# Patient Record
Sex: Male | Born: 1940 | Race: White | Hispanic: No | Smoking: Current every day smoker
Health system: Southern US, Community
[De-identification: ages and names within clinical notes are randomized; demographics above are authoritative.]

## PROBLEM LIST (undated history)

## (undated) DIAGNOSIS — E785 Hyperlipidemia, unspecified: Secondary | ICD-10-CM

## (undated) DIAGNOSIS — I10 Essential (primary) hypertension: Secondary | ICD-10-CM

## (undated) DIAGNOSIS — C801 Malignant (primary) neoplasm, unspecified: Secondary | ICD-10-CM

## (undated) HISTORY — PX: INNER EAR SURGERY: SHX679

## (undated) HISTORY — DX: Malignant (primary) neoplasm, unspecified: C80.1

## (undated) HISTORY — PX: OTHER SURGICAL HISTORY: SHX169

## (undated) HISTORY — DX: Hyperlipidemia, unspecified: E78.5

---

## 1998-05-10 ENCOUNTER — Emergency Department (HOSPITAL_COMMUNITY): Admission: EM | Admit: 1998-05-10 | Discharge: 1998-05-10 | Payer: Self-pay | Admitting: *Deleted

## 2004-11-05 ENCOUNTER — Ambulatory Visit: Payer: Self-pay | Admitting: Family Medicine

## 2005-05-01 ENCOUNTER — Ambulatory Visit: Payer: Self-pay | Admitting: Family Medicine

## 2005-05-14 ENCOUNTER — Ambulatory Visit: Payer: Self-pay | Admitting: Family Medicine

## 2005-06-11 ENCOUNTER — Ambulatory Visit: Payer: Self-pay | Admitting: Family Medicine

## 2005-10-15 ENCOUNTER — Ambulatory Visit: Payer: Self-pay | Admitting: Family Medicine

## 2006-02-02 ENCOUNTER — Ambulatory Visit: Payer: Self-pay | Admitting: Family Medicine

## 2006-03-26 ENCOUNTER — Ambulatory Visit: Payer: Self-pay | Admitting: Family Medicine

## 2006-04-02 ENCOUNTER — Ambulatory Visit: Payer: Self-pay | Admitting: Family Medicine

## 2006-06-08 ENCOUNTER — Ambulatory Visit: Payer: Self-pay | Admitting: Family Medicine

## 2006-07-22 ENCOUNTER — Ambulatory Visit: Payer: Self-pay | Admitting: Family Medicine

## 2006-08-09 ENCOUNTER — Ambulatory Visit: Payer: Self-pay | Admitting: Family Medicine

## 2007-02-09 ENCOUNTER — Ambulatory Visit: Payer: Self-pay | Admitting: Family Medicine

## 2007-03-25 ENCOUNTER — Ambulatory Visit: Payer: Self-pay | Admitting: Family Medicine

## 2007-04-04 ENCOUNTER — Ambulatory Visit (HOSPITAL_COMMUNITY): Admission: RE | Admit: 2007-04-04 | Discharge: 2007-04-05 | Payer: Self-pay | Admitting: Otolaryngology

## 2008-03-24 ENCOUNTER — Encounter: Admission: RE | Admit: 2008-03-24 | Discharge: 2008-03-24 | Payer: Self-pay | Admitting: Otolaryngology

## 2008-04-10 ENCOUNTER — Encounter: Admission: RE | Admit: 2008-04-10 | Discharge: 2008-04-10 | Payer: Self-pay | Admitting: Otolaryngology

## 2011-04-03 NOTE — Op Note (Addendum)
NAME:  Jonathan Sparks, Jonathan Sparks              ACCOUNT NO.:  [id number]   MEDICAL RECORD NO.:  [id number]          PATIENT TYPE:  AMB   LOCATION:  SDS                          FACILITY:  [facility]   PHYSICIAN:  Carolan Shiver, M.D.    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  He  is here today for a Clarion high res 90 K multichannel cochlear implant,  left ear.  [name] has had Meniere's disease for the past 20 years.  He had seen Dr. Flo Shanks originally.  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  Dr. 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  He went on over the years to lose more hearing in the  left ear.  He had undergone a failed endolymphatic sac decompression AS,  failed all conservative medical management, failed intratympanic  dexamethasone and gentamicin.  The gentamicin did stabilize the vertigo  for a long time and his hearing improved, but over the last few years,  his hearing has decreased to non serviceable hearing AS.  On audiometric  testing on March 01, 2007, Mr. Jonathan Sparks was found to have SRTs of 110 dB  AU with 0% discrimination AU.  With a left BTE hearing aid, he had an  SRT of 80 dB with 40% discrimination again with visual aides.  He gained  very little benefit from the hearing aid with aided thresholds in the 50-  70 dB range and SRT of 55 and discrimination of 32%.  Hearing and noise  testing showed very poor scores. CNC list 9 showed 0% for total words  correct and total phonemes correct presented at 110 dB AU.  Hearing and  noise testing 17 out of 56 words correct in the left ear  and none in the  right ear. CNC word list 8 had presented at 80 dB showed total words  correct to be 8 out of 50 for 16%.   Mr. Jonathan Sparks had been counseled over the years about the possibility of  a multichannel cochlear implant.  He and his wife presented for several  sessions and were well informed of the risks and benefits of  multichannel cochlear implantation.  He was told there were no  guarantees, that there were a third of patients were poor performers, a  third were middle performers and a third where high performers and it  was unknown as to which group he would fall into.  Again there were no  guarantees made that he would be able to regain any hearing sensation  with a multichannel cochlear implant.  He underwent preoperative MRI and  CT scanning.  The MRI was done to make sure he did not have any  recurrence  of the right-sided acoustic neuroma and the MRI was negative.  CT scanning of his temporal bone showed a patent cochlea on the left and  the previous mastoidectomy from the endolymphatic sac decompression.  He  received a Pneumovax vaccine in preparation for the procedure and  underwent a complete physical examination by Dr. Lysbeth Galas, his family  doctor.  Preop lab testing was within normal limits.  He was noted to be  a difficult intubation from his previous procedures.   Risks and complications of multichannel cochlear implantation were  explained to [name].  Questions were invited and answered  and informed consent was signed and witnessed.  He completed our written  informed consent form which he was able to read because he was not able  to hear.   JUSTIFICATION FOR OUTPATIENT SETTING:  The patient's age and general  endotracheal anesthesia.   JUSTIFICATION FOR OVERNIGHT STAY:  Two three hours of observation,  facial function and equilibrium after multichannel cochlear  implantation, left ear.   PREOPERATIVE DIAGNOSES:  1. Profound bilateral  sensorineural hearing loss, left ear secondary      to endstage Meniere's disease.  2. 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 Tobie Poet at [facility].)   PROCEDURE:  Clarion high res 90K multichannel cochlear electrode  implant, left ear.   SURGEON:  Carolan Shiver, M.D.   ANESTHESIA:  General endotracheal, Dr. Randa Evens.   COMPLICATIONS:  None.   SUMMARY OF REPORT:  After the patient was taken to the operating room,  he was placed in the supine position.  His oropharynx was anesthetized  by Dr. Randa Evens in preparation for an awake intubation given the history  of difficult intubation.  The patient was ultimately intubated using the  glide scope.  Once intubated, he was properly positioned and monitored.  Elbows and ankles padded with foam rubber and a Foley catheter was  inserted.   The hair was clipped in the left postauricular area.  His hair was taped  and a stocking cap was applied.  A cochlear implant incision inverted J  or lazy S incision was then marked and infiltrated with 9 cc of 1%  Xylocaine with 1:100,000 epinephrine.  Several wire needle electrodes  were then inserted in the left orbicularis oris and occuli muscles,  suprasternal notch and left shoulder and connected to a NIM facial nerve  monitor pre amplifier.  The patient's left ear and hemiface were then  prepped with Betadine and draped in a standard fashion for a  multichannel cochlear implant.  The patient received Ancef 1 gram IV  beginning in the holding area.   The left postauricular incision was then made and carried down through  skin and subcutaneous tissue.  The posterior aspect of the mastoid  cavity was identified and the mastoid was entered.  It was filled with  thickened mucosa.  The mucosa was debrided.  Self-retaining retractors  were placed and the mastoid was further opened with a #5 cutting bur and continuous suction  irrigation.  The digastric ridge was identified as  well as the digastric periosteum.  The facial nerve was found to be  exposed in vertical descending portion and stimulated at 0.5 mA.  Bone  posterior to the fossa incudis was then removed to identify the incus.  The facial recess was then opened with a 2 mm cutting bur and a 2 mm  diamond bur.  Middle ear was entered without difficulty.  The stapes was  identified as was the round window niche.  Facial recess was opened in  an oval fashion as per standard cochlear implant procedure.  The  cochleostomy was then begun using a 2 mm diamond bur just anterior to  the round window niche at the scala tympani.  The bone was thinned to an  eggshell fashion and the endosteum was not opened.  Cottonoids were  placed in the mastoid.   The superior portion of the incision was then created with a 15 blade  and cutting cautery.  The site for the internal receiver had been  previously marked through the skin.  This mark was identified.  A flap 6  mm thick was then created and the underlying soft tissue was removed and  later stored for cutting into small pieces of periosteum and muscle to  occlude the cochleostomy.  Using a template, bony well was drilled in  the skull to full-thickness of the template.  Two pairs of holes were  drilled on either side of the well and a 5-0 Surgilon suture was  threaded through the holes.  Bleeding was controlled with bone wax.  A  trough was drilled between the anterior portion of the well and the  posterior superior aspect of the mastoid cavity.   The cochleostomy was then further opened with 1 mm diamond bur to be the  size of the cochleostomy sizer.   Gowns and gloves were changed.  A Clarion Advanced Bionics high res 90 K  cochlear implant with high focus 1 J electrode model number CI-1400-01,  serial number [id number] was then opened under saline and inspected.  The  implant was found to be in good condition.  The  plastic tube was mounted  on the inserter with the collar of the plastic tube against the collar  of the inserter.  The slot was angled at 2 o'clock.  The ICS was then  placed in the bony well.  The plastic insertion tip was then inserted  into the cochleostomy to the first turn and the implant was inserted  gently in one smooth manner.  The J hook area was left at the  cochleostomy level.  The cochleostomy was then packed tightly with small  squares of periosteum and muscle to completely occlude the cochleostomy  and the facial recess was packed as well with the same tissue.  Two  large rectangles of Gelfoam soaked in saline were then placed in the  mastoid after the extra electrode array was curled into the mastoid  cavity.  A 5-0 Surgilon suture was then tied down to stabilize the ICS  against the bone.  The site was then copiously irrigated with bacitracin  containing saline.  A #7 Jackson-Pratt drain was placed through a separate stab incision and connected to a grenade suction.  A 2-0 silk  basket suture was used to secure the drain.  The incision was then  closed in two layers using interrupted inverted 3-0 Monocryl for  subcutaneous layer and skin was closed with skin staples.  Intraoperative x-ray was taken and the electrode array was found to be  in good position within the cochlea.  Bacitracin ointment was applied.  The ear was padded with Telfa and cotton.  A standard Glasscock mastoid  dressing was applied loosely in a standard fashion.  Foley catheter was  removed.  The patient was awakened, extubated and transferred to his  hospital bed.  He appeared to tolerate the general endotracheal  anesthesia and procedures well and left the operating room in stable  condition.   TOTAL FLUIDS:  Twenty eight hundred cc.   ESTIMATED BLOOD LOSS:  Thirty ml.   TOTAL URINE OUTPUT:  Four hundred ml.   SPONGE NEEDLE AND ____ QA MARKER 704 ____ COUNT  termination of procedure.    SPECIMENS:  No specimens were sent to pathology.   Facial nerve stimulated at 0.5 mA at horizontal portion, the second genu  and the vertical descending portion.  The patient received Ancef 1 gram  IV, Zofran 4 mg IV at the beginning of the procedure and Decadron 10 mg  IV.   Mr. Mcintire will be admitted to an inpatient private room for overnight  observation.  If stable overnight, he will be discharged with his wife  on Apr 05, 2007.  He will be instructed to return to my office on Apr 13, 2007 at 4:20 p.m.  Discharge medications include Ceftin 500 mg p.o.  b.i.d. x10 days with food, Vicodin #30 tablets with one refill 1 p.o.  q.6h. p.r.n. pain, Phenergan suppositories 12.5 mg #2 1 p.r. q.6h.  p.r.n. nausea and Valium 5 mg #20 1 p.o. t.i.d. p.r.n. dizziness or  vertigo.  He is to follow a regular diet, keep his head elevated, avoid  aspirin or aspirin products, avoid nose blowing or sneezing for 2 weeks  without his mouth open and he is to call [phone] for any postoperative  problems related to the procedures.  His stimulation of the implant will  be scheduled for approximately 3 weeks.  He understands it will take  approximately 1-2 years for him how to use the implant.   SUMMARY:  Routine multichannel cochlear implant using [employer]  Clarion high res 90 K cochlear implant with a high focus 1 J electrode  model CI-1400-01, serial number [id number].  The patient's mastoid had been  previously drilled, the facial nerve was exposed at the vertical  descending portion.  The nerve stimulated at the horizontal portion.  The second genu and vertical descending portion had 0.5 mA throughout  the case.  Full length insertion of the electrode array was performed in  the first attempt.  The ICS was stabilized in a bony well and with a 5-0  Surgilon suture.  A #7 Jackson-Pratt drain was used.  A conventional  inverted J incision was used.  The patient was a difficult intubation which was  known previously.  He was intubated with a guide scope without  difficulty.          ______________________________  Carolan Shiver, M.D.    EMK/MEDQ  D:  04/04/2007  T:  04/04/2007  Job:  [id number]   cc:   Delaney Meigs, M.D.

## 2011-04-03 NOTE — Discharge Summary (Signed)
NAME:  Jonathan Sparks, Jonathan Sparks              ACCOUNT NO.:  [id number]   MEDICAL RECORD NO.:  [id number]          PATIENT TYPE:  OIB   LOCATION:  5704                         FACILITY:  [facility]   PHYSICIAN:  Carolan Shiver, M.D.    DATE OF BIRTH:  09-Feb-1941   DATE OF ADMISSION:  04/04/2007  DATE OF DISCHARGE:  04/05/2007                               DISCHARGE SUMMARY   ADMISSION DIAGNOSES:  1. Profound bilateral sensorineural hearing loss.  2. End-stage Meniere's disease, left ear.  3. Status post acoustic neuroma excision, right ear, with profound      deafness, right ear.  (Procedure performed at the [facility] at [facility] by Dr. Tobie Poet in the past).   DISCHARGE DIAGNOSES:  1. Profound bilateral sensorineural hearing loss.  2. End-stage Meniere's disease, left ear.  3. Status post acoustic neuroma excision, right ear, with profound      deafness, right ear.  (Procedure performed at the [facility] at [facility] by Dr. Tobie Poet in the past).   OPERATION:  Clarion High Res 90 K with a 1J electrode cochlear implant,  left ear.   SURGEON:  Carolan Shiver, M.D.   ANESTHESIA:  General endotracheal by Judie Petit, M.D.   COMPLICATIONS:  None.   DISCHARGE STATUS:  Stable.   SUMMARY OF HOSPITALIZATION:  Jonathan Sparks is a 70 year old  white male who was [facility] to [facility] 04, 2007 to  undergo a cochlear implant of his left ear.  He was implanted with a  Clarion High Res 90 K cochlear implant with a 1J electrode in room #10  under general endotracheal anesthesia.  Jonathan Sparks was suffering from  end-stage Meniere's disease in his left ear which left him with almost  no hearing, and he had lost all of his hearing in his right ear  following an excision of an acoustic neuroma in the past by Dr. Tobie Poet at the [place] [facility].  Mr.  Sparks was struggling with  use of a behind the ear hearing aid in the  left ear.  As his hearing had deteriorated over the years, he was  recommended for multichannel cochlear implant, left ear.  The risks and  complications of the procedure were explained to Mr. and Mrs. Sparks.  Questions were invited and answered, and informed consent was signed and  witnessed.   On Apr 04, 2007 Jonathan Sparks was taken to room #10 at [facility]  operating room and underwent an uncomplicated implantation of an  [employer] Clarion High Res 90 K cochlear implant with a 1J  electrode.  A full-length insertion of the electrode array was  accomplished on the first attempt.  The cochleostomy was sealed with  connective tissue and muscle.  His facial nerve had already been exposed  in the vertical descending portion during a previous endolymphatic sac  decompression of his left ear.   Jonathan Sparks had an uncomplicated afebrile postoperative course.  He was  awake, alert, and stable on the first postoperative evening with intact  facial function, no vertigo, and no nystagmus.  He was ambulated on the  first evening without difficulty.   On Apr 05, 2007 he was awake, alert, and stable and had had a quiet  evening.  He was eating.  Facial function was intact.  He had no  nystagmus or complaints of pain, vertigo, or tinnitus.  His Al Pimple drain was removed without difficulty, and he had no evidence of  any subflap hematoma.   He was discharged on the morning of Apr 05, 2007 with his wife who was  instructed to return him to my office on Apr 13, 2007 for follow up and  staple removal.   DISCHARGE MEDICATIONS:  1. Ceftin 500 mg p.o. b.i.d. x10 days with food.  2. Vicodin, #30, one p.o. q.6h. p.r.n. pain.  3. Phenergan suppositories 12.5 mg, #2, one PR q.6h. p.r.n. nausea.  4. Valium 5 mg, #30 tabs, one p.o. t.i.d. p.r.n. vertigo or dizziness.   He is to keep head elevated, avoid aspirin or aspirin products, avoid   nose blowing or sneezing for the next 2 weeks without his mouth open,  and to follow a regular diet.  He is to call [phone] for any  postoperative problems related to the procedures.  He was given both  verbal and written instructions.   There were no pathologic specimens.   ADMISSION LABORATORY DATA:  All within normal limits, with the exception  of a chest X-ray which showed some baseline emphysema.  The patient did  require some nasal O2 during the postoperative recovery to maintain  saturations above 90.   At the time of hospitalization, he was in [facility] OR room #10, the  PACU, and 5700, room 5704.   It should be noted that the patient was known preoperatively to be a  difficult patient to intubate.  This would have been known at [facility], and the patient has a letter concerning his difficult intubation.  Indeed, he was found to have a very anterior larynx; however, he was  intubated awake by Dr. Randa Evens using a GlideScope, and this was  accomplished without difficulty.  He is a difficult intubation, and his  family has been made well aware of this fact for any future anesthetics.           ______________________________  Carolan Shiver, M.D.     EMK/MEDQ  D:  04/05/2007  T:  04/05/2007  Job:  [id number]   cc:   Delaney Meigs, M.D.

## 2011-04-03 NOTE — H&P (Signed)
NAME:  Jonathan, Sparks      ACCOUNT NO.:  [id number]   MEDICAL RECORD NO.:  [id number]          PATIENT TYPE:  AMB   LOCATION:  SDS                          FACILITY:  [facility]   PHYSICIAN:  Carolan Shiver, M.D.    DATE OF BIRTH:  28-Aug-1941   DATE OF ADMISSION:  04/04/2007  DATE OF DISCHARGE:                              HISTORY & PHYSICAL   CHIEF COMPLAINT:  Profound bilateral sensory neural hearing loss and  unable to wear a hearing aid in his left ear with any success.   HISTORY OF PRESENT ILLNESS:  Jonathan Sparks is a 70 year old  white male with Meniere's disease involving his left ear.  He has  developed end-stage Meniere's disease with profound bilateral sensory  neural hearing loss.  He had lost all of his hearing in his right ear  after undergoing an acoustic neuromyic excision by Dr. Tobie Poet at  the [place] [facility] in [place] many years ago.  He had  failed all conservative medical management and recently has failed high-  powered behind-the-ear hearing aid for the left ear.  Audiometric  testing on March 01, 2007 documented SRTs of 110 dB+ with 0%  discrimination in both ears.  An aided threshold from the left hear  revealed aided thresholds in the 50-to-70 dB range, SRT of 55 dB with  32% discrimination.  He basically failed hearing and noise testing and  CNC List A testing.   Mr. and Mrs. Jarboe were counseled that he would benefit from a  multichannel cochlear implant implanted into his left ear.  He underwent  an MRI and CT head scans.  The MRI was performed to make sure he did not  have a recurrent acoustic neuroma in the right side, as he would not be  able to have future MRI head scans and a CT scan was performed.  The MRI  was negative.  The CT scan of his head and temporal bones was also  performed and this showed a patent cochlea in the left and an obstructed  cochlea on the right.  He underwent preop physical examination  by his  family physician, Dr. Lysbeth Galas, at [facility] in [place], [place]  including a Pneumovax vaccine to help prophylax him against a meningitis  in the future.  He underwent caloric ENG testing which showed symmetric  caloric responses so as not to render him __________.   Mr. and Mrs. Brewton were counseled over a long period of time  concerning advantages and disadvantage of multichannel cochlear  implantation and he understood that there were no guarantees that he  would be able to regain any hearing sensation with a cochlear implant.  He was told that a third of patients are low performers, a third middle  and a third high performers and I did not know which group he would fall  into.  He was willing to accept this risk and wanted to proceed with a  multichannel cochlear implant, as he was not gaining much benefit from  his left behind-the-ear hearing aid.   Risks and complications of multichannel cochlear implantation were  explained to  Mr. and Mrs. Durall and he read and signed our written  cochlear implant informed consent form because he could read, but not  hear.  He was scheduled for multichannel cochlear implantation left ear  Apr 04, 2007 under general endotracheal anesthesia in Room 10 at [facility] OR.   PAST MEDICAL HISTORY:  Serious illnesses include:  1. Acoustic neuroma excised from his right ear by Dr. Tobie Poet      in the past.  2. End-stage Meniere's disease left ear.  He had previously undergone      an operation for excision of the acoustic neuroma of his right ear      and he had undergone an endolymphatic sac decompression of his left      ear.   MEDICATIONS:  Included Caduet and Valium.   ALLERGIES TO MEDICATIONS:  None reported.   FAMILY HISTORY:  Positive for some hearing loss.   SOCIAL HISTORY:  He is married, is disabled, has a high-school  education, does not use alcohol products, does use some tobacco  products.    REVIEW OF SYSTEMS:  Positive for a known history of being difficult to  intubate, loss of vision and the loss of hearing.  He denied vertigo,  disabling tinnitus.  He had falling spells or [place] due to the  Meniere's disease involving the left ear.   PHYSICAL EXAMINATION:  VITAL SIGNS:  Blood pressure was 150/80,  temperature 97, height 6 feet 1 inch, weight 220 pounds, respiratory  rate was within normal limits.  GENERAL:  He was awake, alert and conversant.  Although he could not  hear even with his hearing aid very well, he was a good lip reader.  HEENT:  Facial function was intact.  He had no nystagmus.  Pupils:  PERRLA.  __________ and canals were stable.  Both TMs were intact and  mobile.  He had a right postauricular defect from the excision of the  acoustic neuroma and he had a healed left postauricular incision from  the endolymphatic sac decompression.  Nose:  Nasal exams are negative.  Oral cavity, lips, tongue, palate normal.  NECK:  He had an anterior larynx.  Neck was otherwise negative.  CHEST:  Clear.  HEART:  Normal sinus rhythm.  ABDOMEN:  Benign.  GENITALIA/RECTAL EXAMS:  Were not done.  NEUROLOGIC EXAM:  Physiologic with the exception of the profound  bilateral hearing loss.  SKIN:  Normal.   Admission laboratory data showed some emphysema on a chest x-ray.   EKG showed some sinus bradycardia.   Hemoglobin was 16.4, hematocrit 48.1, white blood cell count 9000.  PT  was 13.7, INR 1.0.  Electrolytes were within normal limits with a sodium  of 141, potassium 3.9, chloride 101.  CO2 was slightly elevated at 35.   Preop MRI head scan showed no evidence of a recurrent acoustic neuroma  on the right.   Preoperative CT scan of his head and temporal bones showed a patent  cochlea on the left and an obliterating cochlea on the right.  There  were no other intracranial lesions noted.  Preop audiometric testing showed an SRT of 110 dB in both ears, 0%   discrimination.  Aided thresholds showed SRT of 55 dB in both ears with  32% discrimination.  He basically failed hearing and noise testing and  CNC testing.   IMPRESSION:  1. Profound bilateral sensory neural deafness with little benefit from      a left behind-the-ear  hearing aid.  2. End-stage Meniere's disease left ear.  3. Status post acoustic neuromyic excision right ear in the past      without evidence of recurrence.   RECOMMENDATIONS:  Patient has bee recommended for an advanced Bionics  high-resolution 90K cochlear implant with a high-focus 1J electrode,  Model #6I-1400-O1, Serial [id number].  Procedure is scheduled for general  endotracheal anesthesia at [facility] OR, Room 10 with a 24-hour in-  hospital stay for observation.   Again, risks and complications of multichannel cochlear implantation  were explained to Mr. and Mrs. Verdone in great detail.  He understood  that there were no guarantees that he would be able to hear with a  multichannel cochlear implant.           ______________________________  Carolan Shiver, M.D.     EMK/MEDQ  D:  04/04/2007  T:  04/04/2007  Job:  [id number]   cc:   Dr. Lysbeth Galas

## 2013-11-10 ENCOUNTER — Emergency Department (HOSPITAL_COMMUNITY)
Admission: EM | Admit: 2013-11-10 | Discharge: 2013-11-10 | Disposition: A | Discharge status: Home / Self Care | Payer: Medicare Other | Attending: Emergency Medicine | Admitting: Emergency Medicine

## 2013-11-10 ENCOUNTER — Encounter (HOSPITAL_COMMUNITY): Payer: Self-pay | Admitting: Emergency Medicine

## 2013-11-10 DIAGNOSIS — R5381 Other malaise: Secondary | ICD-10-CM | POA: Insufficient documentation

## 2013-11-10 DIAGNOSIS — I491 Atrial premature depolarization: Secondary | ICD-10-CM

## 2013-11-10 DIAGNOSIS — F17200 Nicotine dependence, unspecified, uncomplicated: Secondary | ICD-10-CM | POA: Insufficient documentation

## 2013-11-10 DIAGNOSIS — F411 Generalized anxiety disorder: Secondary | ICD-10-CM | POA: Insufficient documentation

## 2013-11-10 DIAGNOSIS — F419 Anxiety disorder, unspecified: Secondary | ICD-10-CM

## 2013-11-10 DIAGNOSIS — R002 Palpitations: Secondary | ICD-10-CM | POA: Insufficient documentation

## 2013-11-10 DIAGNOSIS — I10 Essential (primary) hypertension: Secondary | ICD-10-CM | POA: Insufficient documentation

## 2013-11-10 DIAGNOSIS — Z79899 Other long term (current) drug therapy: Secondary | ICD-10-CM | POA: Insufficient documentation

## 2013-11-10 HISTORY — DX: Essential (primary) hypertension: I10

## 2013-11-10 LAB — CBC WITH DIFFERENTIAL/PLATELET
Basophils Absolute: 0 K/uL (ref 0.0–0.1)
Basophils Relative: 0 % (ref 0–1)
Eosinophils Absolute: 0.2 10*3/uL (ref 0.0–0.7)
Eosinophils Relative: 2 % (ref 0–5)
HCT: 48.8 % (ref 39.0–52.0)
Hemoglobin: 17.2 g/dL — ABNORMAL HIGH (ref 13.0–17.0)
Lymphocytes Relative: 24 % (ref 12–46)
Lymphs Abs: 2.4 10*3/uL (ref 0.7–4.0)
MCH: 33.8 pg (ref 26.0–34.0)
MCHC: 35.2 g/dL (ref 30.0–36.0)
MCV: 95.9 fL (ref 78.0–100.0)
Monocytes Absolute: 0.8 K/uL (ref 0.1–1.0)
Monocytes Relative: 8 % (ref 3–12)
Neutro Abs: 6.9 K/uL (ref 1.7–7.7)
Neutrophils Relative %: 66 % (ref 43–77)
Platelets: 255 10*3/uL (ref 150–400)
RBC: 5.09 MIL/uL (ref 4.22–5.81)
RDW: 13.1 % (ref 11.5–15.5)
WBC: 10.4 K/uL (ref 4.0–10.5)

## 2013-11-10 LAB — COMPREHENSIVE METABOLIC PANEL WITH GFR
AST: 5 U/L (ref 0–37)
Alkaline Phosphatase: 94 U/L (ref 39–117)
BUN: 20 mg/dL (ref 6–23)
CO2: 31 meq/L (ref 19–32)
Chloride: 100 meq/L (ref 96–112)
Creatinine, Ser: 1.61 mg/dL — ABNORMAL HIGH (ref 0.50–1.35)
GFR calc non Af Amer: 42 mL/min — ABNORMAL LOW (ref >90)
Potassium: 3.6 meq/L (ref 3.5–5.1)
Total Bilirubin: 0.4 mg/dL (ref 0.3–1.2)

## 2013-11-10 LAB — COMPREHENSIVE METABOLIC PANEL
ALT: 17 U/L (ref 0–53)
Albumin: 3.7 g/dL (ref 3.5–5.2)
Calcium: 8.5 mg/dL (ref 8.4–10.5)
GFR calc Af Amer: 48 mL/min — ABNORMAL LOW (ref >90)
Glucose, Bld: 125 mg/dL — ABNORMAL HIGH (ref 70–99)
Sodium: 141 mEq/L (ref 135–145)
Total Protein: 7.3 g/dL (ref 6.0–8.3)

## 2013-11-10 LAB — POCT I-STAT TROPONIN I: Troponin i, poc: 0 ng/mL (ref 0.00–0.08)

## 2013-11-10 MED ORDER — ACETAMINOPHEN 325 MG PO TABS
650.0000 mg | ORAL_TABLET | Freq: Once | ORAL | Status: AC
  Administered 2013-11-10: 650 mg via ORAL
  Filled 2013-11-10: qty 2

## 2013-11-10 NOTE — ED Notes (Signed)
The pt is visibly upset.  He was just spoken to by the cards of his wife and she is a dnr

## 2013-11-10 NOTE — ED Notes (Signed)
The pt cannot hear he reads lips and his son is at the bedside.  The pt is complaining of  A strange feelin gin his lt upper chest that he has had for 3-4 months.  Tonight the pt was at home when the nursing home called him about his wife who just had a cardiac arrest and is here in the dept also.  The son reports that his father has been upset and stressed out and he feels like this is part of his discoomfort

## 2013-11-10 NOTE — ED Notes (Signed)
Dr Elease Hashimoto is at he bedside talking to him about his wife

## 2013-11-10 NOTE — Discharge Instructions (Signed)
 Premature Beats A premature beat is an extra heartbeat that happens earlier than normal. Premature beats are called premature atrial contractions (PACs) or premature ventricular contractions (PVCs) depending on the area of the heart where they start. CAUSES  Premature beats may be brought on by a variety of factors including:  Emotional stress.  Lack of sleep.  Caffeine.  Asthma medicines.  Stimulants.  Herbal teas.  Dietary supplements.  Alcohol. In most cases, premature beats are not dangerous and are not a sign of serious heart disease. Most patients evaluated for premature beats have completely normal heart function. Rarely, premature beats may be a sign of more significant heart problems or medical illness. SYMPTOMS  Premature beats may cause palpitations. This means you feel like your heart is skipping a beat or beating harder than usual. Sometimes, slight chest pain occurs with premature beats, lasting only a few seconds. This pain has been described as a flopping feeling inside the chest. In many cases, premature beats do not cause any symptoms and they are only detected when an electrocardiography test (EKG) or heart monitoring is performed. DIAGNOSIS  Your caregiver may run some tests to evaluate your heart such as an EKG or echocardiography. You may need to wear a portable heart monitor for several days to record the electrical activity of your heart. Blood testing may also be performed to check your electrolytes and thyroid  function. TREATMENT  Premature beats usually go away with rest. If the problem continues, your caregiver will determine a treatment plan for you.  HOME CARE INSTRUCTIONS  Get plenty of rest over the next few days until your symptoms improve.  Avoid coffee, tea, alcohol, and soda (pop, cola).  Do not smoke. SEEK MEDICAL CARE IF:  Your symptoms continue after 1 to 2 days of rest.  You have new symptoms, such as chest pain or trouble  breathing. SEEK IMMEDIATE MEDICAL CARE IF:  You have severe chest pain or abdominal pain.  You have pain that radiates into the neck, arm, or jaw.  You faint or have extreme weakness.  You have shortness of breath.  Your heartbeat races for more than 5 seconds. MAKE SURE YOU:  Understand these instructions.  Will watch your condition.  Will get help right away if you are not doing well or get worse. Document Released: 12/10/2004 Document Revised: 01/25/2012 Document Reviewed: 07/06/2011 [facility] Patient Information 2014 [place], [place].    Social Anxiety Disorder Social anxiety disorder, previously called social phobia, is a mental disorder. People with social anxiety disorder frequently feel nervous, afraid, or embarrassed when around other people in social situations. They constantly worry that other people are judging or criticizing them for how they look, what they say, or how they act. They may worry that other people might reject them because of their appearance or behavior. Social anxiety disorder is more than just occasional shyness or self-consciousness. It can cause severe emotional distress. It can interfere with daily life activities. Social anxiety disorder also may lead to excessive alcohol or drug use and even suicide.  Social anxiety disorder is actually one of the most common mental disorders. It can develop at any time but usually starts in the teenage years. Women are more commonly affected than men. Social anxiety disorder is also more common in people who have family members with anxiety disorders. It also is more common in people who have physical deformities or conditions with characteristics that are obvious to others, such as stuttered speech or movement abnormalities (Parkinson disease).  SYMPTOMS  In addition to feeling anxious or fearful in social situations, people with social anxiety disorder frequently have physical symptoms. Examples include:  Red face  (blushing).  Racing heart.  Sweating.  Shaky hands or voice.  Confusion.  Lightheadedness.  Upset stomach and diarrhea. DIAGNOSIS  Social anxiety disorder is diagnosed through an assessment by your caregiver. Your caregiver will ask you questions about your mood, thoughts, and reactions in social situations. Your caregiver may ask you about your medical history and use of alcohol or drugs, including prescription medications. Certain medical conditions and the use of certain substances, including caffeine, can cause symptoms similar to social anxiety disorder. Your caregiver may refer you to a mental health specialist for further evaluation or treatment. The criteria for diagnosis of social anxiety disorder are:  Marked fear or anxiety in one or more social situations in which you may be closely watched or studied by others. Examples of such situations include:  Interacting socially (having a conversation with others, going to a party, or meeting strangers).  Being observed (eating or drinking in public or being called on in class).  Performing in front of others (giving a speech).  The social situations of concern almost always cause fear or anxiety, not just occasionally.  People with social anxiety disorder fear that they will be viewed negatively in a way that will be embarrassing, will lead to rejection, or will offend others. This fear is out of proportion to the actual threat posed by the social situation.  Often the triggering social situations are avoided, or they are endured with intense fear or anxiety. The fear, anxiety, or avoidance is persistent and lasts for 6 months or longer.  The anxiety causes difficulty functioning in at least some parts of your daily life. TREATMENT  Several types of treatment are available for social anxiety disorder. These treatments are often used in combination and include:   Talk therapy. Group talk therapy allows you to see that you are  not alone with these problems. Individual talk therapy helps you address your specific anxiety issues with a caring professional. The most effective forms of talk therapy for social anxiety disorder are cognitive behavioral therapy and exposure therapy. Cognitive behavioral therapy helps you to identify and change negative thoughts and beliefs that are at the root of the disorder. Exposure therapy allows you to gradually face the situations that you fear most.  Relaxation and coping techniques. These include deep breathing, self-talk, meditation, visual imagery, and yoga. Relaxation techniques help to keep you calm in social situations.  Social Optician, dispensing.Social skills can be learned on your own or with the help of a talk therapist. They can help you feel more confident and comfortable in social situations.  Medication. For anxiety limited to performance situations (performance anxiety), medication called beta blockers can help by reducing or preventing the physical symptoms of social anxiety disorder. For more persistent and generalized social anxiety, antidepressant medication may be prescribed to help control symptoms. In severe cases of social anxiety disorder, strong antianxiety medication, called benzodiazepines, may be prescribed on a limited basis and for a short time. Document Released: 10/01/2005 Document Revised: 02/27/2013 Document Reviewed: 01/31/2013 [facility] Patient Information 2014 [place], [place].

## 2013-11-10 NOTE — ED Provider Notes (Signed)
CSN: [id number]     Arrival date & time 11/10/13  1913 History   First MD Initiated Contact with Patient 11/10/13 1946     Chief Complaint  Patient presents with  . Dizziness  . Palpitations   (Consider location/radiation/quality/duration/timing/severity/associated sxs/prior Treatment) HPI Comments: Pt was here visiting with spouse who was sent to the ED by EMS after cardiac arrest.  PT's symptoms have been chronic and recurrent over the past 12 years, getting worse, has what he describes a tunnel of vision, crowding in his mind related to social situations, around a lot of people, or going outside to check his mail and reports "anxiety" or "panic" attacks.  He felt somewhat lightheaded, no CP, had pounding in his heart and palpitations described as pounding or racing.  No abd pain, no N/V/D, no syncope.  No back pain.  No loss of vision.  No facial droop, focal numbness or weakness of arms or legs.  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    Patient is a 72 y.o. male presenting with dizziness and palpitations. The history is provided by the patient and a relative.  Dizziness Quality:  Unable to specify Associated symptoms: palpitations   Associated symptoms: no chest pain, no nausea, no shortness of breath and no vomiting   Palpitations Associated symptoms: dizziness   Associated symptoms: no chest pain, no diaphoresis, no nausea, no shortness of breath and no vomiting     Past Medical History  Diagnosis Date  . Hypertension    Past Surgical History  Procedure Laterality Date  . Inner ear surgery     No family history on file. History  Substance Use Topics  . Smoking status: Current Every Day Smoker  . Smokeless tobacco: Not on file  . Alcohol Use: No    Review of Systems  Constitutional: Positive for fatigue. Negative for fever, chills and diaphoresis.  Respiratory: Negative for shortness of breath.   Cardiovascular: Positive for palpitations.  Negative for chest pain.  Gastrointestinal: Negative for nausea, vomiting and abdominal pain.  Neurological: Positive for dizziness and light-headedness. Negative for syncope and weakness.  All other systems reviewed and are negative.    Allergies  Review of patient's allergies indicates not on file.  Home Medications   Current Outpatient Rx  Name  Route  Sig  Dispense  Refill  . atorvastatin (LIPITOR) 40 MG tablet   Oral   Take 40 mg by mouth at bedtime.         . diazepam (VALIUM) 5 MG tablet   Oral   Take 5 mg by mouth 2 (two) times daily.          Marland Kitchen triamterene-hydrochlorothiazide (MAXZIDE-25) 37.5-25 MG per tablet   Oral   Take 1 tablet by mouth daily.          BP 108/56  Pulse 62  Temp(Src) 98.3 F (36.8 C) (Oral)  Resp 18  SpO2 95% Physical Exam  Nursing note and vitals reviewed. Constitutional: He is oriented to person, place, and time. He appears well-developed and well-nourished. No distress.  HENT:  Head: Normocephalic and atraumatic.  Eyes: Conjunctivae and EOM are normal. No scleral icterus.  Neck: Normal range of motion. Neck supple.  Cardiovascular: Normal rate, regular rhythm and intact distal pulses.   Occasional extrasystoles are present.  No murmur heard. Pulmonary/Chest: Effort normal. No respiratory distress. He has no wheezes. He has no rales.  Abdominal: Soft. There is no tenderness.  Musculoskeletal: He exhibits no tenderness.  Neurological: He is alert and oriented to person, place, and time. He exhibits normal muscle tone. Coordination normal.  Skin: Skin is warm and dry. He is not diaphoretic. No pallor.  Psychiatric: Judgment and thought content normal. His affect is blunt. His affect is not angry, not labile and not inappropriate. His speech is not rapid and/or pressured. He is slowed. He does not exhibit a depressed mood.    ED Course  Procedures (including critical care time) Labs Review Labs Reviewed  CBC WITH DIFFERENTIAL -  Abnormal; Notable for the following:    Hemoglobin 17.2 (*)    All other components within normal limits  COMPREHENSIVE METABOLIC PANEL - Abnormal; Notable for the following:    Glucose, Bld 125 (*)    Creatinine, Ser 1.61 (*)    GFR calc non Af Amer 42 (*)    GFR calc Af Amer 48 (*)    All other components within normal limits  POCT I-STAT TROPONIN I   Imaging Review No results found.  EKG Interpretation    Date/Time:  Friday November 10 2013 19:24:14 EST Ventricular Rate:  76 PR Interval:  188 QRS Duration: 76 QT Interval:  356 QTC Calculation: 400 R Axis:   78 Text Interpretation:  Sinus rhythm with Premature supraventricular complexes T wave abnormality, consider inferior ischemia , seen previously Abnormal ECG PAC's are new Confirmed by [facility]  MD, MICHEAL ([id number]) on 11/10/2013 8:34:13 PM           RA sat is 95% and I interpret to be adequate MDM   1. Anxiety   2. Premature atrial beats       Pt with h/o what sounds like social anxiety.  Pt with chronic symptoms, current episode is the same as prior episodes per pt and endorsed by son.  No CP, sweats, nausea.  Not typical anginal symptoms.  ECG shows PAC's, no ischemia.  Troponin is neg.  PT improved after period of time, ok to be discharged, asked for tylenol for mild HA only.  Encouraged follow up with PCP to discuss anxiety. Discussed mild renal insuff and need to follow up with PCP as well        Jonathan Pound. Aleksis Jiggetts, MD 11/11/13 1334

## 2013-11-10 NOTE — ED Notes (Signed)
Pt. reports lightheadedness / dizziness with intermittent palpitations " it don't feel right " for several days , denies chest pain or SOB .

## 2014-06-14 DIAGNOSIS — F411 Generalized anxiety disorder: Secondary | ICD-10-CM | POA: Insufficient documentation

## 2014-06-14 DIAGNOSIS — E782 Mixed hyperlipidemia: Secondary | ICD-10-CM | POA: Insufficient documentation

## 2016-08-05 DIAGNOSIS — I10 Essential (primary) hypertension: Secondary | ICD-10-CM | POA: Insufficient documentation

## 2016-11-04 DIAGNOSIS — C61 Malignant neoplasm of prostate: Secondary | ICD-10-CM | POA: Insufficient documentation

## 2018-02-07 DIAGNOSIS — F3342 Major depressive disorder, recurrent, in full remission: Secondary | ICD-10-CM | POA: Insufficient documentation

## 2019-10-10 NOTE — Progress Notes (Deleted)
   New Patient Office Visit  Assessment & Plan:  ***  Follow-up: No follow-ups on file.   Hendricks Limes, MSN, APRN, FNP-C [facility]  Subjective:  Patient ID: Jonathan Sparks [place], male    DOB: 07-06-1941  Age: 78 y.o. MRN: [id number]  Patient Care Team: Dione Housekeeper, MD as PCP - General (Family Medicine)  CC: No chief complaint on file.   HPI Dael Cannada Greening presents to establish care. *** Patient also ***   ROS  Current Outpatient Medications:  .  atorvastatin (LIPITOR) 40 MG tablet, Take 40 mg by mouth at bedtime., Disp: , Rfl:  .  diazepam (VALIUM) 5 MG tablet, Take 5 mg by mouth 2 (two) times daily. , Disp: , Rfl:  .  triamterene-hydrochlorothiazide (MAXZIDE-25) 37.5-25 MG per tablet, Take 1 tablet by mouth daily., Disp: , Rfl:   Not on File  Past Medical History:  Diagnosis Date  . Hypertension     Past Surgical History:  Procedure Laterality Date  . INNER EAR SURGERY      No family history on file.  Social History   Socioeconomic History  . Marital status: Married    Spouse name: Not on file  . Number of children: Not on file  . Years of education: Not on file  . Highest education level: Not on file  Occupational History  . Not on file  Social Needs  . Financial resource strain: Not on file  . Food insecurity    Worry: Not on file    Inability: Not on file  . Transportation needs    Medical: Not on file    Non-medical: Not on file  Tobacco Use  . Smoking status: Current Every Day Smoker  Substance and Sexual Activity  . Alcohol use: No  . Drug use: No  . Sexual activity: Not on file  Lifestyle  . Physical activity    Days per week: Not on file    Minutes per session: Not on file  . Stress: Not on file  Relationships  . Social Herbalist on phone: Not on file    Gets together: Not on file    Attends religious service: Not on file    Active member of club or organization: Not on file   Attends meetings of clubs or organizations: Not on file    Relationship status: Not on file  . Intimate partner violence    Fear of current or ex partner: Not on file    Emotionally abused: Not on file    Physically abused: Not on file    Forced sexual activity: Not on file  Other Topics Concern  . Not on file  Social History Narrative  . Not on file    Objective:   Today's Vitals: There were no vitals taken for this visit.  Physical Exam

## 2019-10-17 ENCOUNTER — Ambulatory Visit: Payer: Self-pay | Admitting: Family Medicine

## 2019-10-25 ENCOUNTER — Ambulatory Visit: Payer: Self-pay | Admitting: Family Medicine

## 2019-10-26 NOTE — Progress Notes (Deleted)
   New Patient Office Visit  Assessment & Plan:  ***  Follow-up: No follow-ups on file.   Hendricks Limes, MSN, APRN, FNP-C [facility]  Subjective:  Patient ID: Jonathan Sparks, male    DOB: 10/20/1941  Age: 78 y.o. MRN: [id number]  Patient Care Team: Dione Housekeeper, MD as PCP - General (Family Medicine)  CC: No chief complaint on file.   HPI Jonathan Sparks presents to establish care. *** Patient also ***   ROS  Current Outpatient Medications:  .  atorvastatin (LIPITOR) 40 MG tablet, Take 40 mg by mouth at bedtime., Disp: , Rfl:  .  diazepam (VALIUM) 5 MG tablet, Take 5 mg by mouth 2 (two) times daily. , Disp: , Rfl:  .  triamterene-hydrochlorothiazide (MAXZIDE-25) 37.5-25 MG per tablet, Take 1 tablet by mouth daily., Disp: , Rfl:   Not on File  Past Medical History:  Diagnosis Date  . Hypertension     Past Surgical History:  Procedure Laterality Date  . INNER EAR SURGERY      No family history on file.  Social History   Socioeconomic History  . Marital status: Married    Spouse name: Not on file  . Number of children: Not on file  . Years of education: Not on file  . Highest education level: Not on file  Occupational History  . Not on file  Tobacco Use  . Smoking status: Current Every Day Smoker  Substance and Sexual Activity  . Alcohol use: No  . Drug use: No  . Sexual activity: Not on file  Other Topics Concern  . Not on file  Social History Narrative  . Not on file   Social Determinants of Health   Financial Resource Strain:   . Difficulty of Paying Living Expenses: Not on file  Food Insecurity:   . Worried About Charity fundraiser in the Last Year: Not on file  . Ran Out of Food in the Last Year: Not on file  Transportation Needs:   . Lack of Transportation (Medical): Not on file  . Lack of Transportation (Non-Medical): Not on file  Physical Activity:   . Days of Exercise per Week: Not on file  .  Minutes of Exercise per Session: Not on file  Stress:   . Feeling of Stress : Not on file  Social Connections:   . Frequency of Communication with Friends and Family: Not on file  . Frequency of Social Gatherings with Friends and Family: Not on file  . Attends Religious Services: Not on file  . Active Member of Clubs or Organizations: Not on file  . Attends Archivist Meetings: Not on file  . Marital Status: Not on file  Intimate Partner Violence:   . Fear of Current or Ex-Partner: Not on file  . Emotionally Abused: Not on file  . Physically Abused: Not on file  . Sexually Abused: Not on file    Objective:   Today's Vitals: There were no vitals taken for this visit.  Physical Exam

## 2019-10-27 ENCOUNTER — Ambulatory Visit: Payer: Self-pay | Admitting: Family Medicine

## 2019-11-01 ENCOUNTER — Ambulatory Visit: Payer: Self-pay | Admitting: Family Medicine

## 2019-11-02 ENCOUNTER — Other Ambulatory Visit: Payer: Self-pay

## 2019-11-03 ENCOUNTER — Ambulatory Visit (INDEPENDENT_AMBULATORY_CARE_PROVIDER_SITE_OTHER): Payer: Medicare Other | Admitting: Family Medicine

## 2019-11-03 ENCOUNTER — Encounter: Payer: Self-pay | Admitting: Family Medicine

## 2019-11-03 VITALS — BP 138/89 | HR 90 | Temp 96.9°F | Resp 20 | Ht 72.0 in | Wt 211.0 lb

## 2019-11-03 DIAGNOSIS — I872 Venous insufficiency (chronic) (peripheral): Secondary | ICD-10-CM

## 2019-11-03 DIAGNOSIS — K640 First degree hemorrhoids: Secondary | ICD-10-CM

## 2019-11-03 DIAGNOSIS — F411 Generalized anxiety disorder: Secondary | ICD-10-CM

## 2019-11-03 DIAGNOSIS — C61 Malignant neoplasm of prostate: Secondary | ICD-10-CM

## 2019-11-03 DIAGNOSIS — F3342 Major depressive disorder, recurrent, in full remission: Secondary | ICD-10-CM | POA: Diagnosis not present

## 2019-11-03 DIAGNOSIS — I10 Essential (primary) hypertension: Secondary | ICD-10-CM

## 2019-11-03 DIAGNOSIS — Z23 Encounter for immunization: Secondary | ICD-10-CM | POA: Diagnosis not present

## 2019-11-03 DIAGNOSIS — E782 Mixed hyperlipidemia: Secondary | ICD-10-CM

## 2019-11-03 DIAGNOSIS — L853 Xerosis cutis: Secondary | ICD-10-CM

## 2019-11-03 DIAGNOSIS — E559 Vitamin D deficiency, unspecified: Secondary | ICD-10-CM

## 2019-11-03 MED ORDER — CETAPHIL MOISTURIZING EX LOTN: 1.0000 "application " | TOPICAL_LOTION | Freq: Two times a day (BID) | CUTANEOUS | 11 refills | Status: DC

## 2019-11-03 MED ORDER — ESCITALOPRAM OXALATE 10 MG PO TABS: 10.0000 mg | ORAL_TABLET | Freq: Every day | ORAL | 2 refills | Status: DC

## 2019-11-03 MED ORDER — HYDROCORTISONE ACETATE 25 MG RE SUPP
25.0000 mg | Freq: Two times a day (BID) | RECTAL | 0 refills | Status: DC
Start: 2019-11-03 — End: 2019-12-18

## 2019-11-03 MED ORDER — TRIAMTERENE-HCTZ 37.5-25 MG PO TABS: 1.0000 | ORAL_TABLET | Freq: Every day | ORAL | 1 refills | Status: DC

## 2019-11-03 MED ORDER — ATORVASTATIN CALCIUM 40 MG PO TABS: 40.0000 mg | ORAL_TABLET | Freq: Every day | ORAL | 1 refills | Status: DC

## 2019-11-03 NOTE — Patient Instructions (Signed)
It was a pleasure seeing you today, Jonathan Sparks.  Information regarding what we discussed is included in this packet.  Please make an appointment to see me in 6 weeks.    If you had labs performed today, you will be contacted with the abnormal results once they are available, usually in the next 3 business days for routine lab work.  If you had STI testing, a pap smear, or a biopsy performed, expect to be contacted in about 7-10 days. If results are normal, you will not be notified.    In a few days you may receive a survey in the mail or online from [employer] regarding your visit with [place] today. Please take a moment to fill this out. Your feedback is very important to our office. It can help [place] better understand your needs as well as improve your experience and satisfaction. Thank you for taking your time to complete it. We care about you.  Because of recent events of COVID-19 ("Coronavirus"), please follow CDC recommendations:   1. Wash your hand frequently 2. Avoid touching your face 3. Stay away from people who are sick 4. If you have symptoms such as fever, cough, shortness of breath then call your healthcare provider for further guidance 5. If you are sick, STAY AT HOME, unless otherwise directed by your healthcare provider. 6. Follow directions from state and national officials regarding staying safe    Please feel free to call our office if any questions or concerns arise.  Warm Regards, Monia Pouch, FNP-C [facility] [address] [place], [place] [address] [phone]

## 2019-11-04 LAB — CMP14+EGFR
ALT: 29 IU/L (ref 0–44)
AST: 29 IU/L (ref 0–40)
Albumin/Globulin Ratio: 1.6 (ref 1.2–2.2)
Albumin: 4.5 g/dL (ref 3.7–4.7)
Alkaline Phosphatase: 105 IU/L (ref 39–117)
BUN/Creatinine Ratio: 14 (ref 10–24)
BUN: 15 mg/dL (ref 8–27)
Bilirubin Total: 0.4 mg/dL (ref 0.0–1.2)
CO2: 29 mmol/L (ref 20–29)
Calcium: 9.4 mg/dL (ref 8.6–10.2)
Chloride: 96 mmol/L (ref 96–106)
Creatinine, Ser: 1.1 mg/dL (ref 0.76–1.27)
GFR calc Af Amer: 75 mL/min/{1.73_m2} (ref >59)
GFR calc non Af Amer: 65 mL/min/{1.73_m2} (ref >59)
Globulin, Total: 2.8 g/dL (ref 1.5–4.5)
Glucose: 78 mg/dL (ref 65–99)
Potassium: 3.4 mmol/L — ABNORMAL LOW (ref 3.5–5.2)
Sodium: 139 mmol/L (ref 134–144)
Total Protein: 7.3 g/dL (ref 6.0–8.5)

## 2019-11-04 LAB — CBC WITH DIFFERENTIAL/PLATELET
Basophils Absolute: 0.1 10*3/uL (ref 0.0–0.2)
Basos: 1 %
EOS (ABSOLUTE): 0.2 10*3/uL (ref 0.0–0.4)
Eos: 2 %
Hematocrit: 55.2 % — ABNORMAL HIGH (ref 37.5–51.0)
Hemoglobin: 18.7 g/dL — ABNORMAL HIGH (ref 13.0–17.7)
Immature Grans (Abs): 0 10*3/uL (ref 0.0–0.1)
Immature Granulocytes: 0 %
Lymphocytes Absolute: 1.9 10*3/uL (ref 0.7–3.1)
Lymphs: 20 %
MCH: 32.6 pg (ref 26.6–33.0)
MCHC: 33.9 g/dL (ref 31.5–35.7)
MCV: 96 fL (ref 79–97)
Monocytes Absolute: 0.8 10*3/uL (ref 0.1–0.9)
Monocytes: 8 %
Neutrophils Absolute: 6.9 10*3/uL (ref 1.4–7.0)
Neutrophils: 69 %
Platelets: 264 10*3/uL (ref 150–450)
RBC: 5.74 x10E6/uL (ref 4.14–5.80)
RDW: 12.5 % (ref 11.6–15.4)
WBC: 9.9 10*3/uL (ref 3.4–10.8)

## 2019-11-04 LAB — THYROID PANEL WITH TSH
Free Thyroxine Index: 2.2 (ref 1.2–4.9)
T3 Uptake Ratio: 25 % (ref 24–39)
T4, Total: 8.8 ug/dL (ref 4.5–12.0)
TSH: 1.82 u[IU]/mL (ref 0.450–4.500)

## 2019-11-04 LAB — LIPID PANEL
Chol/HDL Ratio: 4.4 ratio (ref 0.0–5.0)
Cholesterol, Total: 159 mg/dL (ref 100–199)
HDL: 36 mg/dL — ABNORMAL LOW (ref >39)
LDL Chol Calc (NIH): 88 mg/dL (ref 0–99)
Triglycerides: 205 mg/dL — ABNORMAL HIGH (ref 0–149)
VLDL Cholesterol Cal: 35 mg/dL (ref 5–40)

## 2019-11-04 LAB — PSA, TOTAL AND FREE
PSA, Free: <0.02 ng/mL
Prostate Specific Ag, Serum: <0.1 ng/mL (ref 0.0–4.0)

## 2019-11-04 LAB — VITAMIN D 25 HYDROXY (VIT D DEFICIENCY, FRACTURES): Vit D, 25-Hydroxy: 15.8 ng/mL — ABNORMAL LOW (ref 30.0–100.0)

## 2019-11-04 NOTE — Progress Notes (Signed)
Subjective:  Patient ID: Jonathan Sparks, male    DOB: [birth year], 78 y.o.   MRN: [id number]  Patient Care Team: Baruch Gouty, FNP as PCP - General (Family Medicine)   Chief Complaint:  Establish Care (new - [place] )   HPI: Jonathan Sparks is a 78 y.o. male presenting on 11/03/2019 for [place] (new - [place] )   Jonathan Sparks is a 78 year old male who presents today to establish care with a new PCP as his retired. He is accompanied by his son today. Pt is very hard of hearing and has trouble even with his hearing aid. He is able to read lips and his son helps with understanding people.   He is vague with his medical history. He last saw his PCP in November of 2011. Pt reports he has been out of all of his medications since June.   He has a history or prostate cancer and underwent cystoscopy with prostate brachytherapy in 2018. He reports he was released by urology. His PSA screenings have been unremarkable since release from urology per pts report. Last PSA normal in EHR. He denies flank pain, rectal pain or pressure, hematuria, frequency, weight loss, urinary retention, or scrotal pain or swelling. No fever or chills.   He has hyperlipidemia and was taking atorvastatin, has been out since June. He does not watch his diet or exercise on a regular basis. He denies chest pain, palpitations, shortness of breath, dizziness, or syncope. He does report some lower extremity swelling and discoloration. States this has been ongoing for several year. No pain in his lower extremities.   He has HTN and was taking Maxzide until June. He does not watch diet or exercise. He does not check his blood pressure at home. He denies headaches, weakness, confusion, focal deficits, or chest pain. He does have lower extremity swelling.   He has anxiety and depression and was on Lexapro and PRN Valium. Pt states he stopped taking the Lexapro and only takes the Valium as needed. Long discussion about  medication purposes, safety, and proper use. Pt has not have Valium or Lexapro since June and has been managing fine. He denies anxiety or depressive symptoms. States at times he will get anxious and dizzy. States he will take the Valium to stop the dizzy feeling and it works well. He states he only has the dizziness when he is in a situation where he is feeling overwhelmed.     Relevant past medical, surgical, family, and social history reviewed and updated as indicated.  Allergies and medications reviewed and updated. Date reviewed: Chart in Epic.   Past Medical History:  Diagnosis Date  . Cancer [facility])    prostate cancer   . Hyperlipidemia   . Hypertension     Past Surgical History:  Procedure Laterality Date  . INNER EAR SURGERY    . prostate cancer seed      Social History   Socioeconomic History  . Marital status: Widowed    Spouse name: Not on file  . Number of children: 2  . Years of education: Not on file  . Highest education level: Not on file  Occupational History  . Occupation: retired   Tobacco Use  . Smoking status: Current Every Day Smoker    Packs/day: 0.50  . Smokeless tobacco: Never Used  Substance and Sexual Activity  . Alcohol use: No  . Drug use: No  . Sexual activity: Not on file  Other Topics  Concern  . Not on file  Social History Narrative   Lives in his home - 1 son lives with him    Social Determinants of Health   Financial Resource Strain:   . Difficulty of Paying Living Expenses: Not on file  Food Insecurity:   . Worried About Charity fundraiser in the Last Year: Not on file  . Ran Out of Food in the Last Year: Not on file  Transportation Needs:   . Lack of Transportation (Medical): Not on file  . Lack of Transportation (Non-Medical): Not on file  Physical Activity:   . Days of Exercise per Week: Not on file  . Minutes of Exercise per Session: Not on file  Stress:   . Feeling of Stress : Not on file  Social Connections:   .  Frequency of Communication with Friends and Family: Not on file  . Frequency of Social Gatherings with Friends and Family: Not on file  . Attends Religious Services: Not on file  . Active Member of Clubs or Organizations: Not on file  . Attends Archivist Meetings: Not on file  . Marital Status: Not on file  Intimate Partner Violence:   . Fear of Current or Ex-Partner: Not on file  . Emotionally Abused: Not on file  . Physically Abused: Not on file  . Sexually Abused: Not on file    Outpatient Encounter Medications as of 11/03/2019  Medication Sig  . atorvastatin (LIPITOR) 40 MG tablet Take 1 tablet (40 mg total) by mouth at bedtime.  . cetaphil (CETAPHIL) lotion Apply 1 application topically 2 (two) times daily.  . diazepam (VALIUM) 5 MG tablet Take 5 mg by mouth 2 (two) times daily.   Marland Kitchen escitalopram (LEXAPRO) 10 MG tablet Take 1 tablet (10 mg total) by mouth daily.  . hydrocortisone (ANUSOL-HC) 25 MG suppository Place 1 suppository (25 mg total) rectally 2 (two) times daily.  Marland Kitchen triamterene-hydrochlorothiazide (MAXZIDE-25) 37.5-25 MG tablet Take 1 tablet by mouth daily.  . [DISCONTINUED] atorvastatin (LIPITOR) 40 MG tablet Take 40 mg by mouth at bedtime.  . [DISCONTINUED] triamterene-hydrochlorothiazide (MAXZIDE-25) 37.5-25 MG per tablet Take 1 tablet by mouth daily.   No facility-administered encounter medications on file as of 11/03/2019.    No Known Allergies  Review of Systems  Constitutional: Negative for activity change, appetite change, chills, diaphoresis, fatigue, fever and unexpected weight change.  HENT: Positive for hearing loss.   Eyes: Negative.  Negative for photophobia and visual disturbance.  Respiratory: Negative for cough, chest tightness and shortness of breath.   Cardiovascular: Positive for leg swelling. Negative for chest pain and palpitations.  Gastrointestinal: Negative for abdominal pain, blood in stool, constipation, diarrhea, nausea, rectal  pain and vomiting.  Endocrine: Negative.  Negative for cold intolerance, polydipsia, polyphagia and polyuria.  Genitourinary: Negative for decreased urine volume, difficulty urinating, discharge, dysuria, enuresis, flank pain, frequency, hematuria, penile pain, penile swelling, scrotal swelling and urgency.  Musculoskeletal: Negative for arthralgias and myalgias.  Skin: Negative.   Allergic/Immunologic: Negative.   Neurological: Negative for dizziness, tremors, seizures, syncope, facial asymmetry, speech difficulty, weakness, light-headedness, numbness and headaches.  Hematological: Negative.  Does not bruise/bleed easily.  Psychiatric/Behavioral: Negative for agitation, behavioral problems, confusion, decreased concentration, dysphoric mood, hallucinations, self-injury, sleep disturbance and suicidal ideas. The patient is nervous/anxious. The patient is not hyperactive.   All other systems reviewed and are negative.       Objective:  BP 138/89   Pulse 90   Temp (!) 96.9  F (36.1 C)   Resp 20   Ht 6' (1.829 m)   Wt 211 lb (95.7 kg)   SpO2 93%   BMI 28.62 kg/m    Wt Readings from Last 3 Encounters:  11/03/19 211 lb (95.7 kg)    Physical Exam Vitals and nursing note reviewed.  Constitutional:      General: He is not in acute distress.    Appearance: Normal appearance. He is well-developed, well-groomed and overweight. He is not ill-appearing, toxic-appearing or diaphoretic.  HENT:     Head: Normocephalic and atraumatic.     Jaw: There is normal jaw occlusion.     Right Ear: Tympanic membrane, ear canal and external ear normal. Decreased hearing noted.     Left Ear: Tympanic membrane, ear canal and external ear normal. Decreased hearing noted.     Nose: Nose normal.     Mouth/Throat:     Lips: Pink.     Mouth: Mucous membranes are moist.     Pharynx: Oropharynx is clear. Uvula midline.  Eyes:     General: Lids are normal.     Extraocular Movements: Extraocular movements  intact.     Conjunctiva/sclera: Conjunctivae normal.     Pupils: Pupils are equal, round, and reactive to light.  Neck:     Thyroid: No thyroid mass, thyromegaly or thyroid tenderness.     Vascular: No carotid bruit or JVD.     Trachea: Trachea and phonation normal.  Cardiovascular:     Rate and Rhythm: Normal rate and regular rhythm.     Chest Wall: PMI is not displaced.     Pulses: Normal pulses.     Heart sounds: Normal heart sounds. No murmur. No friction rub. No gallop.   Pulmonary:     Effort: Pulmonary effort is normal. No respiratory distress.     Breath sounds: Normal breath sounds. No wheezing.  Abdominal:     General: Bowel sounds are normal. There is no distension or abdominal bruit.     Palpations: Abdomen is soft. There is no hepatomegaly or splenomegaly.     Tenderness: There is no abdominal tenderness. There is no right CVA tenderness or left CVA tenderness.     Hernia: No hernia is present.  Genitourinary:    Rectum: External hemorrhoid present.  Musculoskeletal:        General: Normal range of motion.     Cervical back: Normal range of motion and neck supple.     Right lower leg: 1+ Edema present.     Left lower leg: 1+ Edema present.  Lymphadenopathy:     Cervical: No cervical adenopathy.  Skin:    General: Skin is warm and dry.     Capillary Refill: Capillary refill takes less than 2 seconds.     Coloration: Skin is not cyanotic, jaundiced or pale.     Findings: Erythema and rash present. Rash is scaling.     Comments: Bilateral lower leg swelling with slight erythema, scaling, and hyperpigmentation to medial supramalleolar region.  Xerosis to bilateral lower extremities and upper extremities.   Neurological:     General: No focal deficit present.     Mental Status: He is alert and oriented to person, place, and time.     Cranial Nerves: Cranial nerves are intact. No cranial nerve deficit.     Sensory: Sensation is intact. No sensory deficit.     Motor:  Motor function is intact. No weakness.     Coordination: Coordination is intact.  Coordination normal.     Gait: Gait is intact. Gait normal.     Deep Tendon Reflexes: Reflexes are normal and symmetric. Reflexes normal.  Psychiatric:        Attention and Perception: Attention and perception normal.        Mood and Affect: Mood and affect normal. Mood is not anxious or depressed.        Speech: Speech normal.        Behavior: Behavior normal. Behavior is cooperative.        Thought Content: Thought content normal. Thought content does not include homicidal or suicidal ideation. Thought content does not include homicidal or suicidal plan.        Cognition and Memory: Cognition and memory normal.        Judgment: Judgment normal.     Results for orders placed or performed in visit on 11/03/19  CMP14+EGFR  Result Value Ref Range   Glucose 78 65 - 99 mg/dL   BUN 15 8 - 27 mg/dL   Creatinine, Ser 1.10 0.76 - 1.27 mg/dL   GFR calc non Af Amer 65 >59 mL/min/1.73   GFR calc Af Amer 75 >59 mL/min/1.73   BUN/Creatinine Ratio 14 10 - 24   Sodium 139 134 - 144 mmol/L   Potassium 3.4 (L) 3.5 - 5.2 mmol/L   Chloride 96 96 - 106 mmol/L   CO2 29 20 - 29 mmol/L   Calcium 9.4 8.6 - 10.2 mg/dL   Total Protein 7.3 6.0 - 8.5 g/dL   Albumin 4.5 3.7 - 4.7 g/dL   Globulin, Total 2.8 1.5 - 4.5 g/dL   Albumin/Globulin Ratio 1.6 1.2 - 2.2   Bilirubin Total 0.4 0.0 - 1.2 mg/dL   Alkaline Phosphatase 105 39 - 117 IU/L   AST 29 0 - 40 IU/L   ALT 29 0 - 44 IU/L  CBC with Differential/Platelet  Result Value Ref Range   WBC 9.9 3.4 - 10.8 x10E3/uL   RBC 5.74 4.14 - 5.80 x10E6/uL   Hemoglobin 18.7 (H) 13.0 - 17.7 g/dL   Hematocrit 55.2 (H) 37.5 - 51.0 %   MCV 96 79 - 97 fL   MCH 32.6 26.6 - 33.0 pg   MCHC 33.9 31.5 - 35.7 g/dL   RDW 12.5 11.6 - 15.4 %   Platelets 264 150 - 450 x10E3/uL   Neutrophils 69 Not Estab. %   Lymphs 20 Not Estab. %   Monocytes 8 Not Estab. %   Eos 2 Not Estab. %   Basos 1 Not  Estab. %   Neutrophils Absolute 6.9 1.4 - 7.0 x10E3/uL   Lymphocytes Absolute 1.9 0.7 - 3.1 x10E3/uL   Monocytes Absolute 0.8 0.1 - 0.9 x10E3/uL   EOS (ABSOLUTE) 0.2 0.0 - 0.4 x10E3/uL   Basophils Absolute 0.1 0.0 - 0.2 x10E3/uL   Immature Granulocytes 0 Not Estab. %   Immature Grans (Abs) 0.0 0.0 - 0.1 x10E3/uL  Lipid panel  Result Value Ref Range   Cholesterol, Total 159 100 - 199 mg/dL   Triglycerides 205 (H) 0 - 149 mg/dL   HDL 36 (L) >39 mg/dL   VLDL Cholesterol Cal 35 5 - 40 mg/dL   LDL Chol Calc (NIH) 88 0 - 99 mg/dL   Chol/HDL Ratio 4.4 0.0 - 5.0 ratio  Thyroid Panel With TSH  Result Value Ref Range   TSH 1.820 0.450 - 4.500 uIU/mL   T4, Total 8.8 4.5 - 12.0 ug/dL   T3 Uptake Ratio 25 24 -  39 %   Free Thyroxine Index 2.2 1.2 - 4.9  Vitamin D 25 hydroxy  Result Value Ref Range   Vit D, 25-Hydroxy 15.8 (L) 30.0 - 100.0 ng/mL  PSA, total and free  Result Value Ref Range   Prostate Specific Ag, Serum <0.1 0.0 - 4.0 ng/mL   PSA, Free <0.02 N/A ng/mL   PSA, Free Pct CANCELED %       Pertinent labs & imaging results that were available during my care of the patient were reviewed by me and considered in my medical decision making.  Assessment & Plan:  Jesselee was seen today for establish care.  Diagnoses and all orders for this visit:  Essential hypertension with goal blood pressure less than 130/80 BP fairly controlled. Will restart medications today. Goal BP is 130/80. Pt aware to report any persistent high or low readings. DASH diet and exercise encouraged. Exercise at least 150 minutes per week and increase as tolerated. Goal BMI > 25. Stress management encouraged. Avoid nicotine and tobacco product use. Avoid excessive alcohol and NSAID's. Avoid more than 2000 mg of sodium daily. Medications as prescribed. Follow up as scheduled.  -     triamterene-hydrochlorothiazide (MAXZIDE-25) 37.5-25 MG tablet; Take 1 tablet by mouth daily. -     CMP14+EGFR -     CBC with  Differential/Platelet -     Thyroid Panel With TSH  Recurrent major depressive disorder, in full remission ([place]) Anxiety, generalized No severe symptoms present. Occasional dizzy feelings when overwhelmed or anxious. Long discussion disease process, proper management, and medication safety. Pt willing to restart Lexapro. Will not refill, pt aware. Will check thyroid function today.  -     escitalopram (LEXAPRO) 10 MG tablet; Take 1 tablet (10 mg total) by mouth daily. -     Thyroid Panel With TSH  Mixed hyperlipidemia Diet encouraged - increase intake of fresh fruits and vegetables, increase intake of lean proteins. Bake, broil, or grill foods. Avoid fried, greasy, and fatty foods. Avoid fast foods. Increase intake of fiber-rich whole grains. Exercise encouraged - at least 150 minutes per week and advance as tolerated.  Goal BMI < 25. Restart medications as prescribed. Follow up in 3-6 months as discussed.  -     atorvastatin (LIPITOR) 40 MG tablet; Take 1 tablet (40 mg total) by mouth at bedtime. 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 If symptoms persist or worsen, may require referral to vascular surgery.  -     Compression stockings -     cetaphil (CETAPHIL) lotion; Apply 1 application topically 2 (two) times daily.  Vitamin D deficiency Not on repletion therapy. Will check labs today.  -     Vitamin D 25 hydroxy  Prostate cancer ([place]) Released by urology. Last PSA in 2019 was normal, will recheck today.  -     PSA, total and free  Grade I hemorrhoids Symptomatic care discussed in detail. Increase water and fiber intake. Anusol suppository as prescribed. Report any new, worsening, or continued symptoms.  -     hydrocortisone (ANUSOL-HC) 25 MG suppository; Place 1 suppository (25 mg total) rectally 2 (two) times daily.  Need for 23-polyvalent pneumococcal polysaccharide vaccine -      Pneumococcal polysaccharide vaccine 23-valent greater than or equal to 2yo subcutaneous/IM  Need for immunization against influenza -     Flu Vaccine QUAD High Dose(Fluad)     Continue  all other maintenance medications.  Follow up plan: Return in about 6 weeks (around 12/15/2019), or if symptoms worsen or fail to improve.  Continue healthy lifestyle choices, including diet (rich in fruits, vegetables, and lean proteins, and low in salt and simple carbohydrates) and exercise (at least 30 minutes of moderate physical activity daily).  Educational handout given for survey, COVID-19  The above assessment and management plan was discussed with the patient. The patient verbalized understanding of and has agreed to the management plan. Patient is aware to call the clinic if they develop any new symptoms or if symptoms persist or worsen. Patient is aware when to return to the clinic for a follow-up visit. Patient educated on when it is appropriate to go to the emergency department.   Monia Pouch, FNP-C [facility] [phone]

## 2019-11-06 MED ORDER — VITAMIN D (ERGOCALCIFEROL) 1.25 MG (50000 UNIT) PO CAPS: 50000.0000 [IU] | ORAL_CAPSULE | ORAL | 6 refills | Status: DC

## 2019-11-06 NOTE — Addendum Note (Signed)
Addended by: Baruch Gouty on: 11/06/2019 09:33 AM   Modules accepted: Orders

## 2019-11-06 NOTE — Progress Notes (Signed)
Glucose was normal.  Kidney function was normal and liver function was normal.  Potassium was slightly low at 3.4, normal is 3.5.  Make sure you are getting enough potassium in your diet. Hemoglobin and hematocrit are slightly elevated, make sure to avoid cigarette smoking and to drink enough water.  We will continue to monitor this. Glycerides are elevated 205 and HDL was low at 36.  Need to avoid fried, fatty, greasy, and fast foods.  Need to continue Lipitor as prescribed. Thyroid functions normal.  PSA is normal. Vitamin D is very low at 15.8, I want to start you on once weekly repletion therapy.  I will send this into the pharmacy, please pick up and initiate today.

## 2019-11-27 ENCOUNTER — Encounter: Payer: Self-pay | Admitting: Family Medicine

## 2019-12-15 ENCOUNTER — Other Ambulatory Visit: Payer: Self-pay

## 2019-12-18 ENCOUNTER — Encounter: Payer: Self-pay | Admitting: Family Medicine

## 2019-12-18 ENCOUNTER — Ambulatory Visit (INDEPENDENT_AMBULATORY_CARE_PROVIDER_SITE_OTHER): Payer: Medicare Other | Admitting: Family Medicine

## 2019-12-18 ENCOUNTER — Other Ambulatory Visit: Payer: Self-pay

## 2019-12-18 VITALS — BP 124/75 | HR 78 | Temp 97.3°F | Resp 20 | Ht 72.0 in | Wt 202.0 lb

## 2019-12-18 DIAGNOSIS — F3342 Major depressive disorder, recurrent, in full remission: Secondary | ICD-10-CM | POA: Diagnosis not present

## 2019-12-18 DIAGNOSIS — F411 Generalized anxiety disorder: Secondary | ICD-10-CM

## 2019-12-18 DIAGNOSIS — I10 Essential (primary) hypertension: Secondary | ICD-10-CM

## 2019-12-18 NOTE — Patient Instructions (Signed)
DASH Eating Plan DASH stands for "Dietary Approaches to Stop Hypertension." The DASH eating plan is a healthy eating plan that has been shown to reduce high blood pressure (hypertension). Additional health benefits may include reducing the risk of type 2 diabetes mellitus, heart disease, and stroke. The DASH eating plan may also help with weight loss.  WHAT DO I NEED TO KNOW ABOUT THE DASH EATING PLAN? For the DASH eating plan, you will follow these general guidelines:  Choose foods with a percent daily value for sodium of less than 5% (as listed on the food label).  Use salt-free seasonings or herbs instead of table salt or sea salt.  Check with your health care provider or pharmacist before using salt substitutes.  Eat lower-sodium products, often labeled as "lower sodium" or "no salt added."  Eat fresh foods.  Eat more vegetables, fruits, and low-fat dairy products.  Choose whole grains. Look for the word "whole" as the first word in the ingredient list.  Choose fish and skinless chicken or turkey more often than red meat. Limit fish, poultry, and meat to 6 oz (170 g) each day.  Limit sweets, desserts, sugars, and sugary drinks.  Choose heart-healthy fats.  Limit cheese to 1 oz (28 g) per day.  Eat more home-cooked food and less restaurant, buffet, and fast food.  Limit fried foods.  Cook foods using methods other than frying.  Limit canned vegetables. If you do use them, rinse them well to decrease the sodium.  When eating at a restaurant, ask that your food be prepared with less salt, or no salt if possible.  WHAT FOODS CAN I EAT? Seek help from a dietitian for individual calorie needs.  Grains Whole grain or whole wheat bread. Brown rice. Whole grain or whole wheat pasta. Quinoa, bulgur, and whole grain cereals. Low-sodium cereals. Corn or whole wheat flour tortillas. Whole grain cornbread. Whole grain crackers. Low-sodium crackers.  Vegetables Fresh or frozen  vegetables (raw, steamed, roasted, or grilled). Low-sodium or reduced-sodium tomato and vegetable juices. Low-sodium or reduced-sodium tomato sauce and paste. Low-sodium or reduced-sodium canned vegetables.   Fruits All fresh, canned (in natural juice), or frozen fruits.  Meat and Other Protein Products Ground beef (85% or leaner), grass-fed beef, or beef trimmed of fat. Skinless chicken or turkey. Ground chicken or turkey. Pork trimmed of fat. All fish and seafood. Eggs. Dried beans, peas, or lentils. Unsalted nuts and seeds. Unsalted canned beans.  Dairy Low-fat dairy products, such as skim or 1% milk, 2% or reduced-fat cheeses, low-fat ricotta or cottage cheese, or plain low-fat yogurt. Low-sodium or reduced-sodium cheeses.  Fats and Oils Tub margarines without trans fats. Light or reduced-fat mayonnaise and salad dressings (reduced sodium). Avocado. Safflower, olive, or canola oils. Natural peanut or almond butter.  Other Unsalted popcorn and pretzels. The items listed above may not be a complete list of recommended foods or beverages. Contact your dietitian for more options.  WHAT FOODS ARE NOT RECOMMENDED?  Grains White bread. White pasta. White rice. Refined cornbread. Bagels and croissants. Crackers that contain trans fat.  Vegetables Creamed or fried vegetables. Vegetables in a cheese sauce. Regular canned vegetables. Regular canned tomato sauce and paste. Regular tomato and vegetable juices.  Fruits Dried fruits. Canned fruit in light or heavy syrup. Fruit juice.  Meat and Other Protein Products Fatty cuts of meat. Ribs, chicken wings, bacon, sausage, bologna, salami, chitterlings, fatback, hot dogs, bratwurst, and packaged luncheon meats. Salted nuts and seeds. Canned beans with salt.    Dairy Whole or 2% milk, cream, half-and-half, and cream cheese. Whole-fat or sweetened yogurt. Full-fat cheeses or blue cheese. Nondairy creamers and whipped toppings. Processed cheese,  cheese spreads, or cheese curds.  Condiments Onion and garlic salt, seasoned salt, table salt, and sea salt. Canned and packaged gravies. Worcestershire sauce. Tartar sauce. Barbecue sauce. Teriyaki sauce. Soy sauce, including reduced sodium. Steak sauce. Fish sauce. Oyster sauce. Cocktail sauce. Horseradish. Ketchup and mustard. Meat flavorings and tenderizers. Bouillon cubes. Hot sauce. Tabasco sauce. Marinades. Taco seasonings. Relishes.  Fats and Oils Butter, stick margarine, lard, shortening, ghee, and bacon fat. Coconut, palm kernel, or palm oils. Regular salad dressings.  Other Pickles and olives. Salted popcorn and pretzels.  The items listed above may not be a complete list of foods and beverages to avoid. Contact your dietitian for more information.  WHERE CAN I FIND MORE INFORMATION? National Heart, Lung, and Blood Institute: www.nhlbi.nih.gov/health/health-topics/topics/dash/ Document Released: 10/22/2011 Document Revised: 03/19/2014 Document Reviewed: 09/06/2013 ExitCare Patient Information 2015 [employer]. This information is not intended to replace advice given to you by your health care provider. Make sure you discuss any questions you have with your health care provider.   I think that you would greatly benefit from seeing a nutritionist.  If you are interested, please call Dr Sykes at [phone] to schedule an appointment.   

## 2019-12-18 NOTE — Progress Notes (Signed)
Subjective:  Patient ID: Jonathan Sparks, male    DOB: 1941-03-26, 79 y.o.   MRN: [id number]  Patient Care Team: Baruch Gouty, FNP as PCP - General (Family Medicine)   Chief Complaint:  Medical Management of Chronic Issues (follow up HTN )   HPI: Jonathan Sparks is a 79 y.o. male presenting on 12/18/2019 for Medical Management of Chronic Issues (follow up HTN )   1. Essential hypertension with goal blood pressure less than 130/80 Has restarted Maxzide and is tolerating well. BP well controlled at home and normal in office today. Will recheck renal function and electrolytes today. No chest pain, headaches, confusion, weakness, or dizziness. No leg swelling.   2. Recurrent major depressive disorder, in full remission ([place]) 3. Anxiety, generalized Doing well since initiation of Lexapro. Tolerating medication well without associated side effects. Denies anxiety, depression, or dizziness.  Depression screen [facility] 2/9 12/18/2019 11/03/2019  Decreased Interest 0 0  Down, Depressed, Hopeless 0 0  PHQ - 2 Score 0 0   GAD 7 : Generalized Anxiety Score 12/18/2019  Nervous, Anxious, on Edge 0  Control/stop worrying 0  Worry too much - different things 0  Trouble relaxing 0  Restless 0  Easily annoyed or irritable 0  Afraid - awful might happen 0  Total GAD 7 Score 0       Relevant past medical, surgical, family, and social history reviewed and updated as indicated.  Allergies and medications reviewed and updated. Date reviewed: Chart in Epic.   Past Medical History:  Diagnosis Date  . Cancer [facility])    prostate cancer   . Hyperlipidemia   . Hypertension     Past Surgical History:  Procedure Laterality Date  . INNER EAR SURGERY    . prostate cancer seed      Social History   Socioeconomic History  . Marital status: Widowed    Spouse name: Not on file  . Number of children: 2  . Years of education: Not on file  . Highest education level: Not on file  Occupational  History  . Occupation: retired   Tobacco Use  . Smoking status: Current Every Day Smoker    Packs/day: 0.50  . Smokeless tobacco: Never Used  Substance and Sexual Activity  . Alcohol use: No  . Drug use: No  . Sexual activity: Not on file  Other Topics Concern  . Not on file  Social History Narrative   Lives in his home - 1 son lives with him    Social Determinants of Health   Financial Resource Strain:   . Difficulty of Paying Living Expenses: Not on file  Food Insecurity:   . Worried About Charity fundraiser in the Last Year: Not on file  . Ran Out of Food in the Last Year: Not on file  Transportation Needs:   . Lack of Transportation (Medical): Not on file  . Lack of Transportation (Non-Medical): Not on file  Physical Activity:   . Days of Exercise per Week: Not on file  . Minutes of Exercise per Session: Not on file  Stress:   . Feeling of Stress : Not on file  Social Connections:   . Frequency of Communication with Friends and Family: Not on file  . Frequency of Social Gatherings with Friends and Family: Not on file  . Attends Religious Services: Not on file  . Active Member of Clubs or Organizations: Not on file  . Attends Club or  Organization Meetings: Not on file  . Marital Status: Not on file  Intimate Partner Violence:   . Fear of Current or Ex-Partner: Not on file  . Emotionally Abused: Not on file  . Physically Abused: Not on file  . Sexually Abused: Not on file    Outpatient Encounter Medications as of 12/18/2019  Medication Sig  . atorvastatin (LIPITOR) 40 MG tablet Take 1 tablet (40 mg total) by mouth at bedtime.  Marland Kitchen escitalopram (LEXAPRO) 10 MG tablet Take 1 tablet (10 mg total) by mouth daily.  Marland Kitchen triamterene-hydrochlorothiazide (MAXZIDE-25) 37.5-25 MG tablet Take 1 tablet by mouth daily.  . Vitamin D, Ergocalciferol, (DRISDOL) 1.25 MG (50000 UT) CAPS capsule Take 1 capsule (50,000 Units total) by mouth every 7 (seven) days.  . [DISCONTINUED] cetaphil  (CETAPHIL) lotion Apply 1 application topically 2 (two) times daily.  . [DISCONTINUED] diazepam (VALIUM) 5 MG tablet Take 5 mg by mouth 2 (two) times daily.   . [DISCONTINUED] hydrocortisone (ANUSOL-HC) 25 MG suppository Place 1 suppository (25 mg total) rectally 2 (two) times daily.   No facility-administered encounter medications on file as of 12/18/2019.    No Known Allergies  Review of Systems  Constitutional: Negative for activity change, appetite change, chills, diaphoresis, fatigue, fever and unexpected weight change.  HENT: Positive for hearing loss.   Eyes: Negative.  Negative for photophobia and visual disturbance.  Respiratory: Negative for cough, chest tightness and shortness of breath.   Cardiovascular: Negative for chest pain, palpitations and leg swelling.  Gastrointestinal: Negative for abdominal pain, blood in stool, constipation, diarrhea, nausea and vomiting.  Endocrine: Negative.  Negative for polydipsia, polyphagia and polyuria.  Genitourinary: Negative for decreased urine volume, difficulty urinating, dysuria, frequency and urgency.  Musculoskeletal: Negative for arthralgias and myalgias.  Skin: Positive for color change and rash.  Allergic/Immunologic: Negative.   Neurological: Negative for dizziness, tremors, seizures, syncope, facial asymmetry, speech difficulty, weakness, light-headedness, numbness and headaches.  Hematological: Negative.   Psychiatric/Behavioral: Negative for agitation, behavioral problems, confusion, decreased concentration, dysphoric mood, hallucinations, self-injury, sleep disturbance and suicidal ideas. The patient is not nervous/anxious and is not hyperactive.   All other systems reviewed and are negative.       Objective:  BP 124/75   Pulse 78   Temp (!) 97.3 F (36.3 C)   Resp 20   Ht 6' (1.829 m)   Wt 202 lb (91.6 kg)   SpO2 95%   BMI 27.40 kg/m    Wt Readings from Last 3 Encounters:  12/18/19 202 lb (91.6 kg)  11/03/19 211  lb (95.7 kg)    Physical Exam Vitals and nursing note reviewed.  Constitutional:      General: He is not in acute distress.    Appearance: Normal appearance. He is well-developed and well-groomed. He is not ill-appearing, toxic-appearing or diaphoretic.  HENT:     Head: Normocephalic and atraumatic.     Jaw: There is normal jaw occlusion.     Right Ear: Decreased hearing noted.     Left Ear: Decreased hearing noted.     Nose: Nose normal.     Mouth/Throat:     Lips: Pink.     Mouth: Mucous membranes are moist.     Pharynx: Oropharynx is clear. Uvula midline.  Eyes:     General: Lids are normal.     Extraocular Movements: Extraocular movements intact.     Conjunctiva/sclera: Conjunctivae normal.     Pupils: Pupils are equal, round, and reactive to light.  Neck:  Thyroid: No thyroid mass, thyromegaly or thyroid tenderness.     Vascular: No carotid bruit or JVD.     Trachea: Trachea and phonation normal.  Cardiovascular:     Rate and Rhythm: Normal rate and regular rhythm.     Chest Wall: PMI is not displaced.     Pulses: Normal pulses.     Heart sounds: Normal heart sounds. No murmur. No friction rub. No gallop.   Pulmonary:     Effort: Pulmonary effort is normal. No respiratory distress.     Breath sounds: Normal breath sounds. No wheezing.  Abdominal:     General: Bowel sounds are normal. There is no distension or abdominal bruit.     Palpations: Abdomen is soft. There is no hepatomegaly or splenomegaly.     Tenderness: There is no abdominal tenderness. There is no right CVA tenderness or left CVA tenderness.     Hernia: No hernia is present.  Musculoskeletal:        General: Normal range of motion.     Cervical back: Normal range of motion and neck supple.     Right lower leg: No edema.     Left lower leg: No edema.  Lymphadenopathy:     Cervical: No cervical adenopathy.  Skin:    General: Skin is warm and dry.     Capillary Refill: Capillary refill takes less  than 2 seconds.     Coloration: Skin is not cyanotic, jaundiced or pale.  Neurological:     General: No focal deficit present.     Mental Status: He is alert and oriented to person, place, and time.     Cranial Nerves: Cranial nerves are intact. No cranial nerve deficit.     Sensory: Sensation is intact. No sensory deficit.     Motor: Motor function is intact. No weakness.     Coordination: Coordination is intact. Coordination normal.     Gait: Gait is intact. Gait normal.     Deep Tendon Reflexes: Reflexes are normal and symmetric. Reflexes normal.  Psychiatric:        Attention and Perception: Attention and perception normal.        Mood and Affect: Mood and affect normal.        Speech: Speech normal.        Behavior: Behavior normal. Behavior is cooperative.        Thought Content: Thought content normal.        Cognition and Memory: Cognition and memory normal.        Judgment: Judgment normal.     Results for orders placed or performed in visit on 11/03/19  CMP14+EGFR  Result Value Ref Range   Glucose 78 65 - 99 mg/dL   BUN 15 8 - 27 mg/dL   Creatinine, Ser 1.10 0.76 - 1.27 mg/dL   GFR calc non Af Amer 65 >59 mL/min/1.73   GFR calc Af Amer 75 >59 mL/min/1.73   BUN/Creatinine Ratio 14 10 - 24   Sodium 139 134 - 144 mmol/L   Potassium 3.4 (L) 3.5 - 5.2 mmol/L   Chloride 96 96 - 106 mmol/L   CO2 29 20 - 29 mmol/L   Calcium 9.4 8.6 - 10.2 mg/dL   Total Protein 7.3 6.0 - 8.5 g/dL   Albumin 4.5 3.7 - 4.7 g/dL   Globulin, Total 2.8 1.5 - 4.5 g/dL   Albumin/Globulin Ratio 1.6 1.2 - 2.2   Bilirubin Total 0.4 0.0 - 1.2 mg/dL   Alkaline Phosphatase  105 39 - 117 IU/L   AST 29 0 - 40 IU/L   ALT 29 0 - 44 IU/L  CBC with Differential/Platelet  Result Value Ref Range   WBC 9.9 3.4 - 10.8 x10E3/uL   RBC 5.74 4.14 - 5.80 x10E6/uL   Hemoglobin 18.7 (H) 13.0 - 17.7 g/dL   Hematocrit 55.2 (H) 37.5 - 51.0 %   MCV 96 79 - 97 fL   MCH 32.6 26.6 - 33.0 pg   MCHC 33.9 31.5 - 35.7 g/dL    RDW 12.5 11.6 - 15.4 %   Platelets 264 150 - 450 x10E3/uL   Neutrophils 69 Not Estab. %   Lymphs 20 Not Estab. %   Monocytes 8 Not Estab. %   Eos 2 Not Estab. %   Basos 1 Not Estab. %   Neutrophils Absolute 6.9 1.4 - 7.0 x10E3/uL   Lymphocytes Absolute 1.9 0.7 - 3.1 x10E3/uL   Monocytes Absolute 0.8 0.1 - 0.9 x10E3/uL   EOS (ABSOLUTE) 0.2 0.0 - 0.4 x10E3/uL   Basophils Absolute 0.1 0.0 - 0.2 x10E3/uL   Immature Granulocytes 0 Not Estab. %   Immature Grans (Abs) 0.0 0.0 - 0.1 x10E3/uL  Lipid panel  Result Value Ref Range   Cholesterol, Total 159 100 - 199 mg/dL   Triglycerides 205 (H) 0 - 149 mg/dL   HDL 36 (L) >39 mg/dL   VLDL Cholesterol Cal 35 5 - 40 mg/dL   LDL Chol Calc (NIH) 88 0 - 99 mg/dL   Chol/HDL Ratio 4.4 0.0 - 5.0 ratio  Thyroid Panel With TSH  Result Value Ref Range   TSH 1.820 0.450 - 4.500 uIU/mL   T4, Total 8.8 4.5 - 12.0 ug/dL   T3 Uptake Ratio 25 24 - 39 %   Free Thyroxine Index 2.2 1.2 - 4.9  Vitamin D 25 hydroxy  Result Value Ref Range   Vit D, 25-Hydroxy 15.8 (L) 30.0 - 100.0 ng/mL  PSA, total and free  Result Value Ref Range   Prostate Specific Ag, Serum <0.1 0.0 - 4.0 ng/mL   PSA, Free <0.02 N/A ng/mL   PSA, Free Pct CANCELED %       Pertinent labs & imaging results that were available during my care of the patient were reviewed by me and considered in my medical decision making.  Assessment & Plan:  Deigo was seen today for medical management of chronic issues.  Diagnoses and all orders for this visit:  Essential hypertension with goal blood pressure less than 130/80 BP well controlled. Changes were not made in regimen. Goal BP is 130/80. Pt aware to report any persistent high or low readings. DASH diet and exercise encouraged. Exercise at least 150 minutes per week and increase as tolerated. Goal BMI > 25. Stress management encouraged. Avoid nicotine and tobacco product use. Avoid excessive alcohol and NSAID's. Avoid more than 2000 mg of  sodium daily. Medications as prescribed. Labs pending Follow up as scheduled.  -     BMP8+EGFR  Recurrent major depressive disorder, in full remission ([facility]) Anxiety, generalized Doing well on Lexapro. Will continue. Will check sodium today.     Continue all other maintenance medications.  Follow up plan: Return in about 3 months (around 03/16/2020), or if symptoms worsen or fail to improve, for HTN.  Continue healthy lifestyle choices, including diet (rich in fruits, vegetables, and lean proteins, and low in salt and simple carbohydrates) and exercise (at least 30 minutes of moderate physical activity daily).  Educational handout  given for DASH diet   The above assessment and management plan was discussed with the patient. The patient verbalized understanding of and has agreed to the management plan. Patient is aware to call the clinic if they develop any new symptoms or if symptoms persist or worsen. Patient is aware when to return to the clinic for a follow-up visit. Patient educated on when it is appropriate to go to the emergency department.   Monia Pouch, FNP-C [facility] [phone]

## 2019-12-19 ENCOUNTER — Telehealth: Payer: Self-pay | Admitting: Family Medicine

## 2019-12-19 LAB — BMP8+EGFR
BUN/Creatinine Ratio: 12 (ref 10–24)
BUN: 17 mg/dL (ref 8–27)
CO2: 28 mmol/L (ref 20–29)
Calcium: 9.3 mg/dL (ref 8.6–10.2)
Chloride: 98 mmol/L (ref 96–106)
Creatinine, Ser: 1.4 mg/dL — ABNORMAL HIGH (ref 0.76–1.27)
GFR calc Af Amer: 56 mL/min/{1.73_m2} — ABNORMAL LOW (ref >59)
GFR calc non Af Amer: 48 mL/min/{1.73_m2} — ABNORMAL LOW (ref >59)
Glucose: 117 mg/dL — ABNORMAL HIGH (ref 65–99)
Potassium: 4 mmol/L (ref 3.5–5.2)
Sodium: 143 mmol/L (ref 134–144)

## 2019-12-19 MED ORDER — LISINOPRIL 5 MG PO TABS: 5.0000 mg | ORAL_TABLET | Freq: Every day | ORAL | 3 refills | Status: DC

## 2019-12-19 NOTE — Addendum Note (Signed)
Addended by: Baruch Gouty on: 12/19/2019 01:46 PM   Modules accepted: Orders

## 2019-12-19 NOTE — Telephone Encounter (Signed)
Went over lab result notes from Dr Thayer Ohm with patients son. Made son aware that pt needed to stop taking the [facility] Rx and go to [facility] to pick up the Lisinipril Rx that was sent over today for pt to start taking. Also scheduled pt to come in for recheck on BP on 01/02/20.

## 2020-01-01 ENCOUNTER — Other Ambulatory Visit: Payer: Self-pay

## 2020-01-02 ENCOUNTER — Other Ambulatory Visit: Payer: Self-pay

## 2020-01-02 ENCOUNTER — Encounter: Payer: Self-pay | Admitting: Family Medicine

## 2020-01-02 ENCOUNTER — Ambulatory Visit (INDEPENDENT_AMBULATORY_CARE_PROVIDER_SITE_OTHER): Payer: Medicare Other | Admitting: Family Medicine

## 2020-01-02 VITALS — BP 153/70 | HR 66 | Temp 96.8°F | Resp 20 | Ht 72.0 in | Wt 205.0 lb

## 2020-01-02 DIAGNOSIS — I10 Essential (primary) hypertension: Secondary | ICD-10-CM | POA: Diagnosis not present

## 2020-01-02 MED ORDER — LISINOPRIL 10 MG PO TABS: 10.0000 mg | ORAL_TABLET | Freq: Every day | ORAL | 3 refills | Status: DC

## 2020-01-02 NOTE — Progress Notes (Signed)
Subjective:  Patient ID: Jonathan Sparks, male    DOB: 1941-06-01, 79 y.o.   MRN: [id number]  Patient Care Team: Baruch Gouty, FNP as PCP - General (Family Medicine)   Chief Complaint:  Hypertension   HPI: Jonathan Sparks is a 79 y.o. male presenting on 01/02/2020 for Hypertension   Hypertension This is a chronic problem. The current episode started more than 1 year ago. The problem has been waxing and waning since onset. The problem is uncontrolled. Pertinent negatives include no anxiety, blurred vision, chest pain, headaches, malaise/fatigue, neck pain, orthopnea, palpitations, peripheral edema, PND, shortness of breath or sweats. There are no associated agents to hypertension. Risk factors for coronary artery disease include dyslipidemia, male gender and sedentary lifestyle. Past treatments include ACE inhibitors and diuretics. The current treatment provides moderate improvement. Compliance problems include diet and exercise.       Relevant past medical, surgical, family, and social history reviewed and updated as indicated.  Allergies and medications reviewed and updated. Date reviewed: Chart in Epic.   Past Medical History:  Diagnosis Date  . Cancer [facility])    prostate cancer   . Hyperlipidemia   . Hypertension     Past Surgical History:  Procedure Laterality Date  . INNER EAR SURGERY    . prostate cancer seed      Social History   Socioeconomic History  . Marital status: Widowed    Spouse name: Not on file  . Number of children: 2  . Years of education: Not on file  . Highest education level: Not on file  Occupational History  . Occupation: retired   Tobacco Use  . Smoking status: Current Every Day Smoker    Packs/day: 0.50  . Smokeless tobacco: Never Used  Substance and Sexual Activity  . Alcohol use: No  . Drug use: No  . Sexual activity: Not on file  Other Topics Concern  . Not on file  Social History Narrative   Lives in his home - 1 son  lives with him    Social Determinants of Health   Financial Resource Strain:   . Difficulty of Paying Living Expenses: Not on file  Food Insecurity:   . Worried About Charity fundraiser in the Last Year: Not on file  . Ran Out of Food in the Last Year: Not on file  Transportation Needs:   . Lack of Transportation (Medical): Not on file  . Lack of Transportation (Non-Medical): Not on file  Physical Activity:   . Days of Exercise per Week: Not on file  . Minutes of Exercise per Session: Not on file  Stress:   . Feeling of Stress : Not on file  Social Connections:   . Frequency of Communication with Friends and Family: Not on file  . Frequency of Social Gatherings with Friends and Family: Not on file  . Attends Religious Services: Not on file  . Active Member of Clubs or Organizations: Not on file  . Attends Archivist Meetings: Not on file  . Marital Status: Not on file  Intimate Partner Violence:   . Fear of Current or Ex-Partner: Not on file  . Emotionally Abused: Not on file  . Physically Abused: Not on file  . Sexually Abused: Not on file    Outpatient Encounter Medications as of 01/02/2020  Medication Sig  . atorvastatin (LIPITOR) 40 MG tablet Take 1 tablet (40 mg total) by mouth at bedtime.  Marland Kitchen escitalopram (LEXAPRO)  10 MG tablet Take 1 tablet (10 mg total) by mouth daily.  . Vitamin D, Ergocalciferol, (DRISDOL) 1.25 MG (50000 UT) CAPS capsule Take 1 capsule (50,000 Units total) by mouth every 7 (seven) days.  . [DISCONTINUED] lisinopril (ZESTRIL) 5 MG tablet Take 1 tablet (5 mg total) by mouth daily.  Marland Kitchen lisinopril (ZESTRIL) 10 MG tablet Take 1 tablet (10 mg total) by mouth daily.   No facility-administered encounter medications on file as of 01/02/2020.    No Known Allergies  Review of Systems  Constitutional: Negative for activity change, appetite change, chills, diaphoresis, fatigue, fever, malaise/fatigue and unexpected weight change.  HENT: Positive for  hearing loss (implant, left ear).   Eyes: Negative.  Negative for blurred vision, photophobia and visual disturbance.  Respiratory: Negative for cough, chest tightness and shortness of breath.   Cardiovascular: Negative for chest pain, palpitations, orthopnea, leg swelling and PND.  Gastrointestinal: Negative for abdominal pain, blood in stool, constipation, diarrhea, nausea and vomiting.  Endocrine: Negative.  Negative for cold intolerance, heat intolerance, polydipsia, polyphagia and polyuria.  Genitourinary: Negative for decreased urine volume, difficulty urinating, dysuria, frequency and urgency.  Musculoskeletal: Negative for arthralgias, back pain, myalgias and neck pain.  Skin: Negative.   Allergic/Immunologic: Negative.   Neurological: Negative for dizziness, tremors, seizures, syncope, facial asymmetry, speech difficulty, weakness, light-headedness, numbness and headaches.  Hematological: Negative.   Psychiatric/Behavioral: Negative for confusion, hallucinations, sleep disturbance and suicidal ideas.  All other systems reviewed and are negative.       Objective:  BP (!) 153/70 (BP Location: Right Arm, Cuff Size: Large)   Pulse 66   Temp (!) 96.8 F (36 C)   Resp 20   Ht 6' (1.829 m)   Wt 205 lb (93 kg)   SpO2 94%   BMI 27.80 kg/m    Wt Readings from Last 3 Encounters:  01/02/20 205 lb (93 kg)  12/18/19 202 lb (91.6 kg)  11/03/19 211 lb (95.7 kg)    Physical Exam Vitals and nursing note reviewed.  Constitutional:      General: He is not in acute distress.    Appearance: Normal appearance. He is well-developed, well-groomed and overweight. He is not ill-appearing, toxic-appearing or diaphoretic.  HENT:     Head: Normocephalic and atraumatic.     Jaw: There is normal jaw occlusion.     Right Ear: Decreased hearing noted.     Left Ear: Decreased hearing noted.     Ears:     Comments: Cochlear implant left ear    Nose: Nose normal.     Mouth/Throat:     Lips:  Pink.     Mouth: Mucous membranes are moist.     Pharynx: Oropharynx is clear. Uvula midline.  Eyes:     General: Lids are normal.     Extraocular Movements: Extraocular movements intact.     Conjunctiva/sclera: Conjunctivae normal.     Pupils: Pupils are equal, round, and reactive to light.  Neck:     Thyroid: No thyroid mass, thyromegaly or thyroid tenderness.     Vascular: No carotid bruit or JVD.     Trachea: Trachea and phonation normal.  Cardiovascular:     Rate and Rhythm: Normal rate and regular rhythm.     Chest Wall: PMI is not displaced.     Pulses: Normal pulses.     Heart sounds: Normal heart sounds. No murmur. No friction rub. No gallop.   Pulmonary:     Effort: Pulmonary effort is normal.  No respiratory distress.     Breath sounds: Normal breath sounds. No wheezing.  Abdominal:     General: Bowel sounds are normal. There is no distension or abdominal bruit.     Palpations: Abdomen is soft. There is no hepatomegaly or splenomegaly.     Tenderness: There is no abdominal tenderness. There is no right CVA tenderness or left CVA tenderness.     Hernia: No hernia is present.  Musculoskeletal:        General: Normal range of motion.     Cervical back: Normal range of motion and neck supple.     Right lower leg: 1+ Edema present.     Left lower leg: 1+ Edema present.  Lymphadenopathy:     Cervical: No cervical adenopathy.  Skin:    General: Skin is warm and dry.     Capillary Refill: Capillary refill takes less than 2 seconds.     Coloration: Skin is not cyanotic, jaundiced or pale.     Findings: No rash.  Neurological:     General: No focal deficit present.     Mental Status: He is alert and oriented to person, place, and time.     Cranial Nerves: Cranial nerves are intact. No cranial nerve deficit.     Sensory: Sensation is intact. No sensory deficit.     Motor: Motor function is intact. No weakness.     Coordination: Coordination is intact. Coordination normal.      Gait: Gait is intact. Gait normal.     Deep Tendon Reflexes: Reflexes are normal and symmetric. Reflexes normal.  Psychiatric:        Attention and Perception: Attention and perception normal.        Mood and Affect: Mood and affect normal.        Speech: Speech normal.        Behavior: Behavior normal. Behavior is cooperative.        Thought Content: Thought content normal.        Cognition and Memory: Cognition and memory normal.        Judgment: Judgment normal.     Results for orders placed or performed in visit on 12/18/19  BMP8+EGFR  Result Value Ref Range   Glucose 117 (H) 65 - 99 mg/dL   BUN 17 8 - 27 mg/dL   Creatinine, Ser 1.40 (H) 0.76 - 1.27 mg/dL   GFR calc non Af Amer 48 (L) >59 mL/min/1.73   GFR calc Af Amer 56 (L) >59 mL/min/1.73   BUN/Creatinine Ratio 12 10 - 24   Sodium 143 134 - 144 mmol/L   Potassium 4.0 3.5 - 5.2 mmol/L   Chloride 98 96 - 106 mmol/L   CO2 28 20 - 29 mmol/L   Calcium 9.3 8.6 - 10.2 mg/dL       Pertinent labs & imaging results that were available during my care of the patient were reviewed by me and considered in my medical decision making.  Assessment & Plan:  Sukhraj was seen today for hypertension.  Diagnoses and all orders for this visit:  Essential hypertension with goal blood pressure less than 130/80 BP poorly controlled. Changes were made in regimen today, increased lisinopril to 10 mg daily. Goal BP is 130/80. Pt aware to report any persistent high or low readings. DASH diet and exercise encouraged. Exercise at least 150 minutes per week and increase as tolerated. Goal BMI > 25. Stress management encouraged. Avoid nicotine and tobacco product use. Avoid excessive alcohol  and NSAID's. Avoid more than 2000 mg of sodium daily. Medications as prescribed. Follow up as scheduled.  -     lisinopril (ZESTRIL) 10 MG tablet; Take 1 tablet (10 mg total) by mouth daily. -     BMP8+EGFR     Continue all other maintenance  medications.  Follow up plan: Return in about 3 months (around 03/31/2020), or if symptoms worsen or fail to improve, for BP.  Continue healthy lifestyle choices, including diet (rich in fruits, vegetables, and lean proteins, and low in salt and simple carbohydrates) and exercise (at least 30 minutes of moderate physical activity daily).   The above assessment and management plan was discussed with the patient. The patient verbalized understanding of and has agreed to the management plan. Patient is aware to call the clinic if they develop any new symptoms or if symptoms persist or worsen. Patient is aware when to return to the clinic for a follow-up visit. Patient educated on when it is appropriate to go to the emergency department.   Monia Pouch, FNP-C [facility] [phone]

## 2020-01-03 ENCOUNTER — Telehealth: Payer: Self-pay | Admitting: Family Medicine

## 2020-01-03 LAB — BMP8+EGFR
BUN/Creatinine Ratio: 10 (ref 10–24)
BUN: 14 mg/dL (ref 8–27)
CO2: 26 mmol/L (ref 20–29)
Calcium: 8.7 mg/dL (ref 8.6–10.2)
Chloride: 100 mmol/L (ref 96–106)
Creatinine, Ser: 1.34 mg/dL — ABNORMAL HIGH (ref 0.76–1.27)
GFR calc Af Amer: 59 mL/min/{1.73_m2} — ABNORMAL LOW (ref >59)
GFR calc non Af Amer: 51 mL/min/{1.73_m2} — ABNORMAL LOW (ref >59)
Glucose: 100 mg/dL — ABNORMAL HIGH (ref 65–99)
Potassium: 4 mmol/L (ref 3.5–5.2)
Sodium: 141 mmol/L (ref 134–144)

## 2020-01-03 NOTE — Telephone Encounter (Signed)
Pts son called stating that he received a missed call from [place] regarding pt's labs. Went over pt's lab results per Dr Thayer Ohm notes. Pt's son voiced understanding.

## 2020-01-16 ENCOUNTER — Inpatient Hospital Stay (HOSPITAL_COMMUNITY)
Admission: EM | Admit: 2020-01-16 | Discharge: 2020-01-21 | DRG: 522 | Disposition: A | Discharge status: Skilled Nursing Facility | Payer: Medicare Other | Attending: Internal Medicine | Admitting: Internal Medicine

## 2020-01-16 ENCOUNTER — Encounter (HOSPITAL_COMMUNITY): Payer: Self-pay | Admitting: Internal Medicine

## 2020-01-16 ENCOUNTER — Other Ambulatory Visit: Payer: Self-pay

## 2020-01-16 ENCOUNTER — Emergency Department (HOSPITAL_COMMUNITY): Payer: Medicare Other

## 2020-01-16 DIAGNOSIS — Z01818 Encounter for other preprocedural examination: Secondary | ICD-10-CM

## 2020-01-16 DIAGNOSIS — Z20822 Contact with and (suspected) exposure to covid-19: Secondary | ICD-10-CM | POA: Diagnosis not present

## 2020-01-16 DIAGNOSIS — Z8546 Personal history of malignant neoplasm of prostate: Secondary | ICD-10-CM

## 2020-01-16 DIAGNOSIS — H9193 Unspecified hearing loss, bilateral: Secondary | ICD-10-CM | POA: Diagnosis not present

## 2020-01-16 DIAGNOSIS — Z720 Tobacco use: Secondary | ICD-10-CM | POA: Diagnosis not present

## 2020-01-16 DIAGNOSIS — R001 Bradycardia, unspecified: Secondary | ICD-10-CM | POA: Diagnosis not present

## 2020-01-16 DIAGNOSIS — H9190 Unspecified hearing loss, unspecified ear: Secondary | ICD-10-CM | POA: Diagnosis present

## 2020-01-16 DIAGNOSIS — E785 Hyperlipidemia, unspecified: Secondary | ICD-10-CM | POA: Diagnosis not present

## 2020-01-16 DIAGNOSIS — D72829 Elevated white blood cell count, unspecified: Secondary | ICD-10-CM | POA: Diagnosis present

## 2020-01-16 DIAGNOSIS — M2550 Pain in unspecified joint: Secondary | ICD-10-CM | POA: Diagnosis not present

## 2020-01-16 DIAGNOSIS — Z7401 Bed confinement status: Secondary | ICD-10-CM | POA: Diagnosis not present

## 2020-01-16 DIAGNOSIS — Z811 Family history of alcohol abuse and dependence: Secondary | ICD-10-CM

## 2020-01-16 DIAGNOSIS — Z743 Need for continuous supervision: Secondary | ICD-10-CM | POA: Diagnosis not present

## 2020-01-16 DIAGNOSIS — W1830XA Fall on same level, unspecified, initial encounter: Secondary | ICD-10-CM | POA: Diagnosis present

## 2020-01-16 DIAGNOSIS — E782 Mixed hyperlipidemia: Secondary | ICD-10-CM | POA: Diagnosis present

## 2020-01-16 DIAGNOSIS — C189 Malignant neoplasm of colon, unspecified: Secondary | ICD-10-CM | POA: Diagnosis not present

## 2020-01-16 DIAGNOSIS — I10 Essential (primary) hypertension: Secondary | ICD-10-CM | POA: Diagnosis present

## 2020-01-16 DIAGNOSIS — Z96642 Presence of left artificial hip joint: Secondary | ICD-10-CM | POA: Diagnosis not present

## 2020-01-16 DIAGNOSIS — R0689 Other abnormalities of breathing: Secondary | ICD-10-CM | POA: Diagnosis not present

## 2020-01-16 DIAGNOSIS — R52 Pain, unspecified: Secondary | ICD-10-CM | POA: Diagnosis not present

## 2020-01-16 DIAGNOSIS — Z03818 Encounter for observation for suspected exposure to other biological agents ruled out: Secondary | ICD-10-CM | POA: Diagnosis not present

## 2020-01-16 DIAGNOSIS — M25552 Pain in left hip: Secondary | ICD-10-CM | POA: Diagnosis not present

## 2020-01-16 DIAGNOSIS — I4430 Unspecified atrioventricular block: Secondary | ICD-10-CM | POA: Diagnosis not present

## 2020-01-16 DIAGNOSIS — S299XXA Unspecified injury of thorax, initial encounter: Secondary | ICD-10-CM | POA: Diagnosis not present

## 2020-01-16 DIAGNOSIS — Z8249 Family history of ischemic heart disease and other diseases of the circulatory system: Secondary | ICD-10-CM | POA: Diagnosis not present

## 2020-01-16 DIAGNOSIS — W19XXXD Unspecified fall, subsequent encounter: Secondary | ICD-10-CM | POA: Diagnosis not present

## 2020-01-16 DIAGNOSIS — F17210 Nicotine dependence, cigarettes, uncomplicated: Secondary | ICD-10-CM | POA: Diagnosis present

## 2020-01-16 DIAGNOSIS — Z4789 Encounter for other orthopedic aftercare: Secondary | ICD-10-CM | POA: Diagnosis not present

## 2020-01-16 DIAGNOSIS — Z7901 Long term (current) use of anticoagulants: Secondary | ICD-10-CM | POA: Diagnosis not present

## 2020-01-16 DIAGNOSIS — S72002A Fracture of unspecified part of neck of left femur, initial encounter for closed fracture: Principal | ICD-10-CM

## 2020-01-16 DIAGNOSIS — Z79899 Other long term (current) drug therapy: Secondary | ICD-10-CM | POA: Diagnosis not present

## 2020-01-16 DIAGNOSIS — Z471 Aftercare following joint replacement surgery: Secondary | ICD-10-CM | POA: Diagnosis not present

## 2020-01-16 DIAGNOSIS — S72042D Displaced fracture of base of neck of left femur, subsequent encounter for closed fracture with routine healing: Secondary | ICD-10-CM | POA: Diagnosis not present

## 2020-01-16 DIAGNOSIS — R404 Transient alteration of awareness: Secondary | ICD-10-CM | POA: Diagnosis not present

## 2020-01-16 DIAGNOSIS — Z419 Encounter for procedure for purposes other than remedying health state, unspecified: Secondary | ICD-10-CM

## 2020-01-16 DIAGNOSIS — W19XXXA Unspecified fall, initial encounter: Secondary | ICD-10-CM

## 2020-01-16 LAB — CBC WITH DIFFERENTIAL/PLATELET
Abs Immature Granulocytes: 0.11 10*3/uL — ABNORMAL HIGH (ref 0.00–0.07)
Basophils Absolute: 0.1 10*3/uL (ref 0.0–0.1)
Basophils Relative: 0 %
Eosinophils Absolute: 0 10*3/uL (ref 0.0–0.5)
Eosinophils Relative: 0 %
HCT: 49.3 % (ref 39.0–52.0)
Hemoglobin: 16.4 g/dL (ref 13.0–17.0)
Immature Granulocytes: 1 %
Lymphocytes Relative: 6 %
Lymphs Abs: 1 10*3/uL (ref 0.7–4.0)
MCH: 31.8 pg (ref 26.0–34.0)
MCHC: 33.3 g/dL (ref 30.0–36.0)
MCV: 95.7 fL (ref 80.0–100.0)
Monocytes Absolute: 0.9 10*3/uL (ref 0.1–1.0)
Monocytes Relative: 5 %
Neutro Abs: 15.6 10*3/uL — ABNORMAL HIGH (ref 1.7–7.7)
Neutrophils Relative %: 88 %
Platelets: 244 10*3/uL (ref 150–400)
RBC: 5.15 MIL/uL (ref 4.22–5.81)
RDW: 12.5 % (ref 11.5–15.5)
WBC: 17.7 10*3/uL — ABNORMAL HIGH (ref 4.0–10.5)
nRBC: 0 % (ref 0.0–0.2)

## 2020-01-16 LAB — BASIC METABOLIC PANEL
Anion gap: 10 (ref 5–15)
BUN: 16 mg/dL (ref 8–23)
CO2: 27 mmol/L (ref 22–32)
Calcium: 8.5 mg/dL — ABNORMAL LOW (ref 8.9–10.3)
Chloride: 100 mmol/L (ref 98–111)
Creatinine, Ser: 1.19 mg/dL (ref 0.61–1.24)
GFR calc Af Amer: >60 mL/min (ref >60)
GFR calc non Af Amer: 59 mL/min — ABNORMAL LOW (ref >60)
Glucose, Bld: 130 mg/dL — ABNORMAL HIGH (ref 70–99)
Potassium: 3.9 mmol/L (ref 3.5–5.1)
Sodium: 137 mmol/L (ref 135–145)

## 2020-01-16 MED ORDER — FENTANYL CITRATE (PF) 100 MCG/2ML IJ SOLN
25.0000 ug | Freq: Once | INTRAMUSCULAR | Status: AC
  Administered 2020-01-16: 25 ug via INTRAVENOUS
  Filled 2020-01-16: qty 2

## 2020-01-16 MED ORDER — MORPHINE SULFATE (PF) 2 MG/ML IV SOLN
0.5000 mg | INTRAVENOUS | Status: DC | PRN
  Administered 2020-01-17 (×3): 0.5 mg via INTRAVENOUS
  Filled 2020-01-16 (×3): qty 1

## 2020-01-16 MED ORDER — HYDRALAZINE HCL 20 MG/ML IJ SOLN: 10.0000 mg | INTRAMUSCULAR | Status: DC | PRN

## 2020-01-16 NOTE — ED Notes (Signed)
Jonathan Sparks daughter in law [phone] looking for an update

## 2020-01-16 NOTE — ED Notes (Signed)
Out to X ray

## 2020-01-16 NOTE — ED Provider Notes (Signed)
[place] EMERGENCY DEPARTMENT Provider Note   CSN: [id number] Arrival date & time: 01/16/20  2140     History Chief Complaint  Patient presents with  . Hip Pain    Jonathan Sparks is a 79 y.o. male.  Patient is a 79 year old gentleman who has a past medical history of hypertension, prostate cancer, hyperlipidemia and is hard of hearing presenting to the emergency department for left hip pain after mechanical fall.  Per patient, he was reaching down into his car to grab a pack of cigarettes when he accidentally fell onto his left hip.  Unable to ambulate since then with significant pain.  Patient was transferred via EMS and given 10 mcg of morphine en route.  He denies hitting his head or passing out or any other injuries.        Past Medical History:  Diagnosis Date  . Cancer [facility])    prostate cancer   . Hyperlipidemia   . Hypertension     Patient Active Problem List   Diagnosis Date Noted  . Closed left hip fracture, initial encounter ([place]) 01/16/2020  . Peripheral vascular disease with stasis dermatitis 11/03/2019  . Xerosis cutis 11/03/2019  . Vitamin D deficiency 11/03/2019  . Recurrent major depressive disorder, in full remission ([place]) 02/07/2018  . Prostate cancer ([place]) 11/04/2016  . Essential hypertension with goal blood pressure less than 130/80 08/05/2016  . Anxiety, generalized 06/14/2014  . Mixed hyperlipidemia 06/14/2014    Past Surgical History:  Procedure Laterality Date  . INNER EAR SURGERY    . prostate cancer seed         Family History  Problem Relation Age of Onset  . Alcohol abuse Father   . Heart attack Brother   . Hyperlipidemia Son   . Hypertension Son     Social History   Tobacco Use  . Smoking status: Current Every Day Smoker    Packs/day: 0.50  . Smokeless tobacco: Never Used  Substance Use Topics  . Alcohol use: No  . Drug use: No    Home Medications Prior to Admission medications   Medication  Sig Start Date End Date Taking? Authorizing Provider  atorvastatin (LIPITOR) 40 MG tablet Take 1 tablet (40 mg total) by mouth at bedtime. 11/03/19 02/01/20  Baruch Gouty, FNP  escitalopram (LEXAPRO) 10 MG tablet Take 1 tablet (10 mg total) by mouth daily. 11/03/19 02/01/20  Baruch Gouty, FNP  lisinopril (ZESTRIL) 10 MG tablet Take 1 tablet (10 mg total) by mouth daily. 01/02/20   Baruch Gouty, FNP  Vitamin D, Ergocalciferol, (DRISDOL) 1.25 MG (50000 UT) CAPS capsule Take 1 capsule (50,000 Units total) by mouth every 7 (seven) days. 11/06/19   Baruch Gouty, FNP    Allergies    Patient has no known allergies.  Review of Systems   Review of Systems  Constitutional: Negative for chills and fever.  Respiratory: Negative for cough and shortness of breath.   Cardiovascular: Negative for chest pain.  Gastrointestinal: Negative for abdominal pain, nausea and vomiting.  Genitourinary: Negative for dysuria.  Musculoskeletal: Positive for arthralgias. Negative for back pain, myalgias, neck pain and neck stiffness.  Skin: Negative for rash and wound.  Neurological: Negative for dizziness, syncope and light-headedness.  All other systems reviewed and are negative.   Physical Exam Updated Vital Signs BP (!) 167/76 (BP Location: Right Arm)   Pulse 68   Temp 97.8 F (36.6 C) (Oral)   Resp 17   Ht  6' (1.829 m)   Wt 93 kg   SpO2 90%   BMI 27.80 kg/m   Physical Exam Vitals and nursing note reviewed. Exam conducted with a chaperone present.  Constitutional:      General: He is not in acute distress.    Appearance: Normal appearance. He is not ill-appearing, toxic-appearing or diaphoretic.  HENT:     Head: Normocephalic and atraumatic.  Eyes:     Conjunctiva/sclera: Conjunctivae normal.  Pulmonary:     Effort: Pulmonary effort is normal.  Musculoskeletal:     Comments: Left lower extremity is shortened and externally rotated.  Normal distal sensation and pulses.  Significant pain with  any movement of the hip.  Normal knee and ankle.  Skin:    General: Skin is dry.     Comments: No visible wounds  Neurological:     Mental Status: He is alert.  Psychiatric:        Mood and Affect: Mood normal.     ED Results / Procedures / Treatments   Labs (all labs ordered are listed, but only abnormal results are displayed) Labs Reviewed  RESPIRATORY PANEL BY RT PCR (FLU A&B, COVID)  BASIC METABOLIC PANEL  CBC WITH DIFFERENTIAL/PLATELET  TYPE AND SCREEN    EKG None  Radiology DG Chest 1 View  Result Date: 01/16/2020 CLINICAL DATA:  Fall with hip pain EXAM: CHEST  1 VIEW COMPARISON:  03/29/2007 FINDINGS: Right lung is clear. Slightly elevated left diaphragm with atelectasis at the left base. Mild cardiomegaly with aortic atherosclerosis. Negative for pleural effusion or pneumothorax. IMPRESSION: 1. Cardiomegaly without edema or infiltrate 2. Linear atelectasis or scarring at the left base Electronically Signed   By: Donavan Foil M.D.   On: 01/16/2020 22:41   DG Hip Unilat W or Wo Pelvis 2-3 Views Left  Result Date: 01/16/2020 CLINICAL DATA:  79 year old male with fall and left hip pain. EXAM: DG HIP (WITH OR WITHOUT PELVIS) 2-3V LEFT COMPARISON:  None. FINDINGS: There is a mildly displaced fracture of the left femoral neck. No other acute fracture identified. 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 No dislocation. Degenerative changes of the lower lumbar spine. The soft tissues are unremarkable. Prostate brachytherapy seeds noted. IMPRESSION: Mildly displaced left femoral neck fracture. Electronically Signed   By: Anner Crete M.D.   On: 01/16/2020 22:41    Procedures Procedures (including critical care time)  Medications Ordered in ED Medications  morphine 2 MG/ML injection 0.5 mg (has no administration in time range)  hydrALAZINE (APRESOLINE) injection 10 mg (has no administration in time range)  fentaNYL (SUBLIMAZE) injection 25 mcg (25 mcg Intravenous Given 01/16/20 2307)    ED  Course  I have reviewed the triage vital signs and the nursing notes.  Pertinent labs & imaging results that were available during my care of the patient were reviewed by me and considered in my medical decision making (see chart for details).  Clinical Course as of Jan 16 2340  Tue Jan 15, 3238  [id number] 79 year old with mechanical fall resulting in a left femoral neck fracture.  Hemodynamically stable.  Not anticoagulated.  Spoke with Dr. Griffin Basil on-call for orthopedic surgery who will consult on the patient tomorrow.  N.p.o. after midnight.  Neurovascularly intact. Spoke with Dr. Hal Hope who will admit    [KM]    Clinical Course User Index [KM] Kristine Royal   MDM Rules/Calculators/A&P  The patient appears reasonably stabilized for admission considering the current resources, flow, and capabilities available in the ED at this time, and I doubt any other [facility] requiring further screening and/or treatment in the ED prior to admission.  Final Clinical Impression(s) / ED Diagnoses Final diagnoses:  Pre-op examination    Rx / DC Orders ED Discharge Orders    None       Kristine Royal 01/16/20 2341    Margette Fast, MD 01/17/20 1329

## 2020-01-16 NOTE — H&P (Signed)
History and Physical    Jonathan Sparks [id number] DOB: 01-10-1941 DOA: 01/16/2020  PCP: Baruch Gouty, FNP  Patient coming from: Home.  Chief Complaint: Fall.  HPI: Jonathan Sparks is a 79 y.o. male with history of hypertension, hyperlipidemia, hard of hearing, prostate cancer and a fall when he was trying to reach to get a pack of cigarettes in his car.  Denies hitting his head or loss of consciousness.  Denies chest pain or shortness of breath.  Over the fall patient had left hip pain and was brought to the ER.  ED Course: X-rays revealed left hip fracture.  On-call orthopedic surgeon Dr. Griffin Basil has been consulted.  Labs are largely unremarkable with exception of mild leukocytosis Covid test is negative EKG shows normal sinus rhythm.  Patient admitted for further management.  Review of Systems: As per HPI, rest all negative.   Past Medical History:  Diagnosis Date  . Cancer [facility])    prostate cancer   . Hyperlipidemia   . Hypertension     Past Surgical History:  Procedure Laterality Date  . INNER EAR SURGERY    . prostate cancer seed       reports that he has been smoking. He has been smoking about 0.50 packs per day. He has never used smokeless tobacco. He reports that he does not drink alcohol or use drugs.  No Known Allergies  Family History  Problem Relation Age of Onset  . Alcohol abuse Father   . Heart attack Brother   . Hyperlipidemia Son   . Hypertension Son     Prior to Admission medications   Medication Sig Start Date End Date Taking? Authorizing Provider  atorvastatin (LIPITOR) 40 MG tablet Take 1 tablet (40 mg total) by mouth at bedtime. 11/03/19 02/01/20  Baruch Gouty, FNP  escitalopram (LEXAPRO) 10 MG tablet Take 1 tablet (10 mg total) by mouth daily. 11/03/19 02/01/20  Baruch Gouty, FNP  lisinopril (ZESTRIL) 10 MG tablet Take 1 tablet (10 mg total) by mouth daily. 01/02/20   Baruch Gouty, FNP  Vitamin D, Ergocalciferol, (DRISDOL) 1.25 MG  (50000 UT) CAPS capsule Take 1 capsule (50,000 Units total) by mouth every 7 (seven) days. 11/06/19   Baruch Gouty, FNP    Physical Exam: Constitutional: Moderately built and nourished. Vitals:   01/16/20 2200 01/16/20 2209  BP:  (!) 167/76  Pulse:  68  Resp:  17  Temp:  97.8 F (36.6 C)  TempSrc:  Oral  SpO2:  90%  Weight: 93 kg   Height: 6' (1.829 m)    Eyes: Anicteric no pallor. ENMT: No discharge from the ears eyes nose or mouth. Neck: No mass felt.  No neck rigidity. Respiratory: No rhonchi or crepitations. Cardiovascular: S1-S2 heard. Abdomen: Soft nontender bowel sounds present. Musculoskeletal: Pain on moving left hip. Skin: No rash. Neurologic: Alert awake oriented to time place and person.  Moves all extremities. Psychiatric: Appears normal but normal affect.   Labs on Admission: I have personally reviewed following labs and imaging studies  CBC: No results for input(s): WBC, NEUTROABS, HGB, HCT, MCV, PLT in the last 168 hours. Basic Metabolic Panel: No results for input(s): NA, K, CL, CO2, GLUCOSE, BUN, CREATININE, CALCIUM, MG, PHOS in the last 168 hours. GFR: Estimated Creatinine Clearance: 50.7 mL/min (A) (by C-G formula based on SCr of 1.34 mg/dL (H)). Liver Function Tests: No results for input(s): AST, ALT, ALKPHOS, BILITOT, PROT, ALBUMIN in the last 168 hours. No  results for input(s): LIPASE, AMYLASE in the last 168 hours. No results for input(s): AMMONIA in the last 168 hours. Coagulation Profile: No results for input(s): INR, PROTIME in the last 168 hours. Cardiac Enzymes: No results for input(s): CKTOTAL, CKMB, CKMBINDEX, TROPONINI in the last 168 hours. BNP (last 3 results) No results for input(s): PROBNP in the last 8760 hours. HbA1C: No results for input(s): HGBA1C in the last 72 hours. CBG: No results for input(s): GLUCAP in the last 168 hours. Lipid Profile: No results for input(s): CHOL, HDL, LDLCALC, TRIG, CHOLHDL, LDLDIRECT in the last  72 hours. Thyroid Function Tests: No results for input(s): TSH, T4TOTAL, FREET4, T3FREE, THYROIDAB in the last 72 hours. Anemia Panel: No results for input(s): VITAMINB12, FOLATE, FERRITIN, TIBC, IRON, RETICCTPCT in the last 72 hours. Urine analysis: No results found for: COLORURINE, APPEARANCEUR, LABSPEC, PHURINE, GLUCOSEU, HGBUR, BILIRUBINUR, KETONESUR, PROTEINUR, UROBILINOGEN, NITRITE, LEUKOCYTESUR Sepsis Labs: @LABRCNTIP (procalcitonin:4,lacticidven:4) )No results found for this or any previous visit (from the past 240 hour(s)).   Radiological Exams on Admission: DG Chest 1 View  Result Date: 01/16/2020 CLINICAL DATA:  Fall with hip pain EXAM: CHEST  1 VIEW COMPARISON:  03/29/2007 FINDINGS: Right lung is clear. Slightly elevated left diaphragm with atelectasis at the left base. Mild cardiomegaly with aortic atherosclerosis. Negative for pleural effusion or pneumothorax. IMPRESSION: 1. Cardiomegaly without edema or infiltrate 2. Linear atelectasis or scarring at the left base Electronically Signed   By: Donavan Foil M.D.   On: 01/16/2020 22:41   DG Hip Unilat W or Wo Pelvis 2-3 Views Left  Result Date: 01/16/2020 CLINICAL DATA:  79 year old male with fall and left hip pain. EXAM: DG HIP (WITH OR WITHOUT PELVIS) 2-3V LEFT COMPARISON:  None. FINDINGS: There is a mildly displaced fracture of the left femoral neck. No other acute fracture identified. 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 No dislocation. Degenerative changes of the lower lumbar spine. The soft tissues are unremarkable. Prostate brachytherapy seeds noted. IMPRESSION: Mildly displaced left femoral neck fracture. Electronically Signed   By: Anner Crete M.D.   On: 01/16/2020 22:41    EKG: Independently reviewed.  Normal sinus rhythm.  Assessment/Plan Principal Problem:   Closed left hip fracture, initial encounter [facility]) Active Problems:   Essential hypertension with goal blood pressure less than 130/80   Mixed hyperlipidemia     1. Left hip fracture status post mechanical fall for which patient will be kept n.p.o. past midnight limiting medication and orthopedic surgeon Dr. Griffin Basil has been consulted.  Likely surgery in the morning. 2. Hypertension since patient will be n.p.o. we will keep patient as needed IV hydralazine. 3. Hyperlipidemia continue statins once patient can take orally. 4. Leukocytosis could be reactionary.  Chest x-ray unremarkable check UA.  Patient is afebrile. 5. Patient is very hard of hearing. 6. History of prostate cancer.  Given that patient will need surgery and close monitoring for any further deterioration will need inpatient status.   DVT prophylaxis: SCDs since patient is getting surgery avoiding anticoagulation. Code Status: Full code. Family Communication: We will need to discuss with family. Disposition Plan: May need rehab. Consults called: Orthopedics. Admission status: Inpatient.   Rise Patience MD [facility] Pager [phone].  If 7PM-7AM, please contact night-coverage [email or web address] Password [facility]  01/16/2020, 11:22 PM

## 2020-01-16 NOTE — ED Triage Notes (Signed)
Pt fell pot of parked car getting cigarettes. Denies LOC. Pain in left hip and groin area. Left leg shortened and rotated outward. Pain was 10 out of 10. EMS Gave 10 mcg of Morphine. Pt stated pain is now 6. Pulses present.

## 2020-01-17 ENCOUNTER — Encounter (HOSPITAL_COMMUNITY): Payer: Self-pay | Admitting: Internal Medicine

## 2020-01-17 DIAGNOSIS — E782 Mixed hyperlipidemia: Secondary | ICD-10-CM

## 2020-01-17 DIAGNOSIS — D72829 Elevated white blood cell count, unspecified: Secondary | ICD-10-CM

## 2020-01-17 LAB — URINALYSIS, ROUTINE W REFLEX MICROSCOPIC
Bilirubin Urine: NEGATIVE
Glucose, UA: NEGATIVE mg/dL
Hgb urine dipstick: NEGATIVE
Ketones, ur: 5 mg/dL — AB
Leukocytes,Ua: NEGATIVE
Nitrite: NEGATIVE
Protein, ur: NEGATIVE mg/dL
Specific Gravity, Urine: 1.019 (ref 1.005–1.030)
pH: 6 (ref 5.0–8.0)

## 2020-01-17 LAB — RESPIRATORY PANEL BY RT PCR (FLU A&B, COVID)
Influenza A by PCR: NEGATIVE
Influenza B by PCR: NEGATIVE
SARS Coronavirus 2 by RT PCR: NEGATIVE

## 2020-01-17 LAB — TYPE AND SCREEN
ABO/RH(D): O POS
Antibody Screen: NEGATIVE

## 2020-01-17 LAB — BASIC METABOLIC PANEL
Anion gap: 9 (ref 5–15)
BUN: 16 mg/dL (ref 8–23)
CO2: 31 mmol/L (ref 22–32)
Calcium: 8.4 mg/dL — ABNORMAL LOW (ref 8.9–10.3)
Chloride: 97 mmol/L — ABNORMAL LOW (ref 98–111)
Creatinine, Ser: 1.15 mg/dL (ref 0.61–1.24)
GFR calc Af Amer: >60 mL/min (ref >60)
GFR calc non Af Amer: >60 mL/min (ref >60)
Glucose, Bld: 122 mg/dL — ABNORMAL HIGH (ref 70–99)
Potassium: 4.2 mmol/L (ref 3.5–5.1)
Sodium: 137 mmol/L (ref 135–145)

## 2020-01-17 LAB — CBC WITH DIFFERENTIAL/PLATELET
Abs Immature Granulocytes: 0.07 10*3/uL (ref 0.00–0.07)
Basophils Absolute: 0 10*3/uL (ref 0.0–0.1)
Basophils Relative: 0 %
Eosinophils Absolute: 0 10*3/uL (ref 0.0–0.5)
Eosinophils Relative: 0 %
HCT: 48.1 % (ref 39.0–52.0)
Hemoglobin: 16 g/dL (ref 13.0–17.0)
Immature Granulocytes: 1 %
Lymphocytes Relative: 6 %
Lymphs Abs: 0.9 10*3/uL (ref 0.7–4.0)
MCH: 31.7 pg (ref 26.0–34.0)
MCHC: 33.3 g/dL (ref 30.0–36.0)
MCV: 95.4 fL (ref 80.0–100.0)
Monocytes Absolute: 0.9 10*3/uL (ref 0.1–1.0)
Monocytes Relative: 6 %
Neutro Abs: 12.7 10*3/uL — ABNORMAL HIGH (ref 1.7–7.7)
Neutrophils Relative %: 87 %
Platelets: 220 10*3/uL (ref 150–400)
RBC: 5.04 MIL/uL (ref 4.22–5.81)
RDW: 12.6 % (ref 11.5–15.5)
WBC: 14.7 10*3/uL — ABNORMAL HIGH (ref 4.0–10.5)
nRBC: 0 % (ref 0.0–0.2)

## 2020-01-17 LAB — SURGICAL PCR SCREEN
MRSA, PCR: NEGATIVE
Staphylococcus aureus: NEGATIVE

## 2020-01-17 LAB — ABO/RH: ABO/RH(D): O POS

## 2020-01-17 MED ORDER — POVIDONE-IODINE 10 % EX SWAB: 2.0000 "application " | Freq: Once | CUTANEOUS | Status: DC

## 2020-01-17 MED ORDER — TRANEXAMIC ACID-NACL 1000-0.7 MG/100ML-% IV SOLN
1000.0000 mg | INTRAVENOUS | Status: AC
  Administered 2020-01-18: 1000 mg via INTRAVENOUS

## 2020-01-17 MED ORDER — METOPROLOL TARTRATE 25 MG PO TABS
12.5000 mg | ORAL_TABLET | Freq: Two times a day (BID) | ORAL | Status: DC
  Administered 2020-01-18 – 2020-01-19 (×3): 12.5 mg via ORAL
  Filled 2020-01-17 (×3): qty 1

## 2020-01-17 MED ORDER — OXYCODONE HCL 5 MG PO TABS
5.0000 mg | ORAL_TABLET | ORAL | Status: DC | PRN
  Administered 2020-01-17 – 2020-01-18 (×3): 5 mg via ORAL
  Filled 2020-01-17 (×3): qty 1

## 2020-01-17 MED ORDER — CEFAZOLIN SODIUM-DEXTROSE 2-4 GM/100ML-% IV SOLN
2.0000 g | INTRAVENOUS | Status: AC
  Administered 2020-01-18: 2 g via INTRAVENOUS
  Filled 2020-01-17: qty 100

## 2020-01-17 MED ORDER — ACETAMINOPHEN 325 MG PO TABS: 650.0000 mg | ORAL_TABLET | Freq: Four times a day (QID) | ORAL | Status: DC | PRN

## 2020-01-17 MED ORDER — CHLORHEXIDINE GLUCONATE 4 % EX LIQD
60.0000 mL | Freq: Once | CUTANEOUS | Status: AC
  Administered 2020-01-18: 4 via TOPICAL

## 2020-01-17 MED ORDER — ENSURE PRE-SURGERY PO LIQD
296.0000 mL | Freq: Once | ORAL | Status: DC
  Filled 2020-01-17: qty 296

## 2020-01-17 MED ORDER — ACETAMINOPHEN 500 MG PO TABS
1000.0000 mg | ORAL_TABLET | Freq: Three times a day (TID) | ORAL | Status: DC
  Administered 2020-01-17 – 2020-01-18 (×3): 1000 mg via ORAL
  Filled 2020-01-17 (×3): qty 2

## 2020-01-17 NOTE — ED Notes (Signed)
Checked in on Pt who resting well, Vitals WDL.

## 2020-01-17 NOTE — Plan of Care (Signed)

## 2020-01-17 NOTE — ED Notes (Signed)
Checked in on Pt, he was sleeping well and appeared to be comfortable. Vitals WDL, symmetrical rise and fall of chest with breathing pattern.

## 2020-01-17 NOTE — Progress Notes (Signed)
PROGRESS NOTE  Jonathan Sparks [id number] DOB: [birth year] DOA: 01/16/2020 PCP: Baruch Gouty, FNP  HPI/Recap of past 24 hours:  Pleasant, very hard of hearing, he points to his hearing aid, states "run out of battery"  Assessment/Plan: Principal Problem:   Closed left hip fracture, initial encounter [facility]) Active Problems:   Essential hypertension with goal blood pressure less than 130/80   Mixed hyperlipidemia  Left hip fracture status post mechanical fall  -Ortho consulted, plan for surgery tomorrow  Leukocytosis, reactive, does not appear septic, chest x-ray unremarkable, UA no bacteria, no fever CBC in the morning   Hypertension, blood pressure elevated, hold lisinopril perioperatively, start low-dose Lopressor with holding parameter Plan of resuming lisinopril post op  Prostate cancer, chart review in remission, was released from urology, PSA in December 2020 was unremarkable  Hearing impairment, family to bring in Games developer for hearing aids  DVT Prophylaxis: sCDs for now in anticipating surgery Code Status: Full  Family Communication: son Jonathan Sparks who is HPOA over the phone  Disposition Plan:    Patient came from:    home                                                                                                      Anticipated d/c place: likely will need SNF post op, family agrees  Barriers to d/c OR conditions which need to be met to effect a safe d/c: surgery planned on 3/4, will need Pt eval, likely will needs snf   Consultants:  ortho  Procedures:  None  Antibiotics:  None   Objective: BP 140/70   Pulse 63   Temp 98.2 F (36.8 C)   Resp 16   Ht 6' (1.829 m)   Wt 93 kg   SpO2 93%   BMI 27.80 kg/m   Intake/Output Summary (Last 24 hours) at 01/17/2020 0801 Last data filed at 01/17/2020 0500 Gross per 24 hour  Intake --  Output 200 ml  Net -200 ml   Filed Weights   01/16/20 2200  Weight: 93 kg    Exam: Patient is examined  daily including today on 01/17/2020, exams remain the same as of yesterday except that has changed    General:  NAD,   Cardiovascular: RRR  Respiratory: CTABL  Abdomen: Soft/ND/NT, positive BS  Musculoskeletal: No Edema, left leg externally rotated, shortened   Neuro: alert, pleasant,  very hard of hearing  Data Reviewed: Basic Metabolic Panel: Recent Labs  Lab 01/16/20 2317  NA 137  K 3.9  CL 100  CO2 27  GLUCOSE 130*  BUN 16  CREATININE 1.19  CALCIUM 8.5*   Liver Function Tests: No results for input(s): AST, ALT, ALKPHOS, BILITOT, PROT, ALBUMIN in the last 168 hours. No results for input(s): LIPASE, AMYLASE in the last 168 hours. No results for input(s): AMMONIA in the last 168 hours. CBC: Recent Labs  Lab 01/16/20 2317  WBC 17.7*  NEUTROABS 15.6*  HGB 16.4  HCT 49.3  MCV 95.7  PLT 244   Cardiac Enzymes:   No results for input(s): CKTOTAL, CKMB,  CKMBINDEX, TROPONINI in the last 168 hours. BNP (last 3 results) No results for input(s): BNP in the last 8760 hours.  ProBNP (last 3 results) No results for input(s): PROBNP in the last 8760 hours.  CBG: No results for input(s): GLUCAP in the last 168 hours.  Recent Results (from the past 240 hour(s))  Respiratory Panel by RT PCR (Flu A&B, Covid) - Nasopharyngeal Swab     Status: None   Collection Time: 01/16/20 10:16 PM   Specimen: Nasopharyngeal Swab  Result Value Ref Range Status   SARS Coronavirus 2 by RT PCR NEGATIVE NEGATIVE Final    Comment: (NOTE) SARS-CoV-2 target nucleic acids are NOT DETECTED. The SARS-CoV-2 RNA is generally detectable in upper respiratoy specimens during the acute phase of infection. The lowest concentration of SARS-CoV-2 viral copies this assay can detect is 131 copies/mL. A negative result does not preclude SARS-Cov-2 infection and should not be used as the sole basis for treatment or other patient management decisions. A negative result may occur with  improper specimen  collection/handling, submission of specimen other than nasopharyngeal swab, presence of viral mutation(s) within the areas targeted by this assay, and inadequate number of viral copies (<131 copies/mL). A negative result must be combined with clinical observations, patient history, and epidemiological information. The expected result is Negative. Fact Sheet for Patients:  PinkCheek.be Fact Sheet for Healthcare Providers:  GravelBags.it This test is not yet ap proved or cleared by the [place] FDA and  has been authorized for detection and/or diagnosis of SARS-CoV-2 by FDA under an Emergency Use Authorization (EUA). This EUA will remain  in effect (meaning this test can be used) for the duration of the COVID-19 declaration under Section 564(b)(1) of the Act, 21 U.S.C. section 360bbb-3(b)(1), unless the authorization is terminated or revoked sooner.    Influenza A by PCR NEGATIVE NEGATIVE Final   Influenza B by PCR NEGATIVE NEGATIVE Final    Comment: (NOTE) The Xpert Xpress SARS-CoV-2/FLU/RSV assay is intended as an aid in  the diagnosis of influenza from Nasopharyngeal swab specimens and  should not be used as a sole basis for treatment. Nasal washings and  aspirates are unacceptable for Xpert Xpress SARS-CoV-2/FLU/RSV  testing. Fact Sheet for Patients: PinkCheek.be Fact Sheet for Healthcare Providers: GravelBags.it This test is not yet approved or cleared by the [place] FDA and  has been authorized for detection and/or diagnosis of SARS-CoV-2 by  FDA under an Emergency Use Authorization (EUA). This EUA will remain  in effect (meaning this test can be used) for the duration of the  Covid-19 declaration under Section 564(b)(1) of the Act, 21  U.S.C. section 360bbb-3(b)(1), unless the authorization is  terminated or revoked. Performed at [facility], [place] [address], [place], [place] [address]   Surgical pcr screen     Status: None   Collection Time: 01/17/20  5:24 AM   Specimen: Nasal Mucosa; Nasal Swab  Result Value Ref Range Status   MRSA, PCR NEGATIVE NEGATIVE Final   Staphylococcus aureus NEGATIVE NEGATIVE Final    Comment: (NOTE) The Xpert SA Assay (FDA approved for NASAL specimens in patients 65 years of age and older), is one component of a comprehensive surveillance program. It is not intended to diagnose infection nor to guide or monitor treatment. Performed at [facility], [place] [address] [place], [place] [address]      Studies: DG Chest 1 View  Result Date: 01/16/2020 CLINICAL DATA:  Fall with hip pain EXAM:  CHEST  1 VIEW COMPARISON:  03/29/2007 FINDINGS: Right lung is clear. Slightly elevated left diaphragm with atelectasis at the left base. Mild cardiomegaly with aortic atherosclerosis. Negative for pleural effusion or pneumothorax. IMPRESSION: 1. Cardiomegaly without edema or infiltrate 2. Linear atelectasis or scarring at the left base Electronically Signed   By: Donavan Foil M.D.   On: 01/16/2020 22:41   DG Hip Unilat W or Wo Pelvis 2-3 Views Left  Result Date: 01/16/2020 CLINICAL DATA:  79 year old male with fall and left hip pain. EXAM: DG HIP (WITH OR WITHOUT PELVIS) 2-3V LEFT COMPARISON:  None. FINDINGS: There is a mildly displaced fracture of the left femoral neck. No other acute fracture identified. 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 No dislocation. Degenerative changes of the lower lumbar spine. The soft tissues are unremarkable. Prostate brachytherapy seeds noted. IMPRESSION: Mildly displaced left femoral neck fracture. Electronically Signed   By: Anner Crete M.D.   On: 01/16/2020 22:41    Scheduled Meds:  Continuous Infusions:   Time spent: 82mns I have personally reviewed and interpreted on  01/17/2020 daily labs, tele strips, imagings as discussed above under date review session and assessment and  plans.  I reviewed all nursing notes, pharmacy notes, consultant notes,  vitals, pertinent old records  I have discussed plan of care as described above with RN , patient and family on 01/17/2020   FFlorencia ReasonsMD, PhD, FACP  [facility]  Available via Epic secure chat 7am-7pm for nonurgent issues Please page for urgent issues, pager number available through [email or web address] .   01/17/2020, 8:01 AM  LOS: 1 day

## 2020-01-17 NOTE — Progress Notes (Signed)
Initial Nutrition Assessment  DOCUMENTATION CODES:   Not applicable  INTERVENTION:   -Ensure Enlive po BID, each supplement provides 350 kcal and 20 grams of protein -MVI with minerals daily  NUTRITION DIAGNOSIS:   Increased nutrient needs related to post-op healing as evidenced by estimated needs.  GOAL:   Patient will meet greater than or equal to 90% of their needs  MONITOR:   PO intake, Supplement acceptance, Weight trends, Labs, Skin, I & O's  REASON FOR ASSESSMENT:   Consult Hip fracture protocol  ASSESSMENT:   Jonathan Sparks is a 79 y.o. male with history of hypertension, hyperlipidemia, hard of hearing, prostate cancer and a fall when he was trying to reach to get a pack of cigarettes in his car.  Denies hitting his head or loss of consciousness.  Denies chest pain or shortness of breath.  Over the fall patient had left hip pain and was brought to the ER.  Pt admitted with lt hip fracture s/p fall.   Reviewed I/O's: -200 ml x 24 hours  Per orthopedics notes, plan for surgery tomorrow.   Pt sleeping soundly, snoring audibly at time of visit. He did not respond to name being called. Meal tray set up in front of pt, unattempted.   Reviewed wt hx; wt has been stable over the past 3 months.  Labs reviewed.   NUTRITION - FOCUSED PHYSICAL EXAM:    Most Recent Value  Orbital Region  No depletion  Upper Arm Region  No depletion  Thoracic and Lumbar Region  No depletion  Buccal Region  No depletion  Temple Region  No depletion  Clavicle Bone Region  No depletion  Clavicle and Acromion Bone Region  No depletion  Scapular Bone Region  No depletion  Dorsal Hand  No depletion  Patellar Region  No depletion  Anterior Thigh Region  No depletion  Posterior Calf Region  No depletion  Edema (RD Assessment)  None  Hair  Reviewed  Eyes  Reviewed  Mouth  Reviewed  Skin  Reviewed  Nails  Reviewed       Diet Order:   Diet Order            Diet NPO time  specified  Diet effective midnight        Diet Heart Room service appropriate? Yes; Fluid consistency: Thin  Diet effective now              EDUCATION NEEDS:   No education needs have been identified at this time  Skin:  Skin Assessment: Reviewed RN Assessment  Last BM:  01/16/20  Height:   Ht Readings from Last 1 Encounters:  01/16/20 6' (1.829 m)    Weight:   Wt Readings from Last 1 Encounters:  01/16/20 93 kg    Ideal Body Weight:  80.9 kg  BMI:  Body mass index is 27.8 kg/m.  Estimated Nutritional Needs:   Kcal:  2050-2250  Protein:  100-115 grams  Fluid:  > 2 L    Loistine Chance, RD, LDN, [place] Registered Dietitian II Certified Diabetes Care and Education Specialist Please refer to [facility] for RD and/or RD on-call/weekend/after hours pager

## 2020-01-17 NOTE — Consult Note (Signed)
Reason for Consult:Left hip fx Referring Physician: Golden Mulderig Sparks is an 79 y.o. male.  HPI: Jonathan Sparks fell while trying to get some cigarettes out of his car. He had immediate left hip pain and could not get up or ambulate. He was brought to the ED where x-rays showed a left femoral neck fx and orthopedic surgery was consulted. He is essentially deaf and communication was done through unidirectional writing and him speaking.  Past Medical History:  Diagnosis Date  . Cancer [facility])    prostate cancer   . Hyperlipidemia   . Hypertension     Past Surgical History:  Procedure Laterality Date  . INNER EAR SURGERY    . prostate cancer seed      Family History  Problem Relation Age of Onset  . Alcohol abuse Father   . Heart attack Brother   . Hyperlipidemia Son   . Hypertension Son     Social History:  reports that he has been smoking. He has been smoking about 0.50 packs per day. He has never used smokeless tobacco. He reports that he does not drink alcohol or use drugs.  Allergies: No Known Allergies  Medications: I have reviewed the patient's current medications.  Results for orders placed or performed during the hospital encounter of 01/16/20 (from the past 48 hour(s))  Respiratory Panel by RT PCR (Flu A&B, Covid) - Nasopharyngeal Swab     Status: None   Collection Time: 01/16/20 10:16 PM   Specimen: Nasopharyngeal Swab  Result Value Ref Range   SARS Coronavirus 2 by RT PCR NEGATIVE NEGATIVE    Comment: (NOTE) SARS-CoV-2 target nucleic acids are NOT DETECTED. The SARS-CoV-2 RNA is generally detectable in upper respiratoy specimens during the acute phase of infection. The lowest concentration of SARS-CoV-2 viral copies this assay can detect is 131 copies/mL. A negative result does not preclude SARS-Cov-2 infection and should not be used as the sole basis for treatment or other patient management decisions. A negative result may occur with  improper specimen  collection/handling, submission of specimen other than nasopharyngeal swab, presence of viral mutation(s) within the areas targeted by this assay, and inadequate number of viral copies (<131 copies/mL). A negative result must be combined with clinical observations, patient history, and epidemiological information. The expected result is Negative. Fact Sheet for Patients:  PinkCheek.be Fact Sheet for Healthcare Providers:  GravelBags.it This test is not yet ap proved or cleared by the [place] FDA and  has been authorized for detection and/or diagnosis of SARS-CoV-2 by FDA under an Emergency Use Authorization (EUA). This EUA will remain  in effect (meaning this test can be used) for the duration of the COVID-19 declaration under Section 564(b)(1) of the Act, 21 U.S.C. section 360bbb-3(b)(1), unless the authorization is terminated or revoked sooner.    Influenza A by PCR NEGATIVE NEGATIVE   Influenza B by PCR NEGATIVE NEGATIVE    Comment: (NOTE) The Xpert Xpress SARS-CoV-2/FLU/RSV assay is intended as an aid in  the diagnosis of influenza from Nasopharyngeal swab specimens and  should not be used as a sole basis for treatment. Nasal washings and  aspirates are unacceptable for Xpert Xpress SARS-CoV-2/FLU/RSV  testing. Fact Sheet for Patients: PinkCheek.be Fact Sheet for Healthcare Providers: GravelBags.it This test is not yet approved or cleared by the [place] FDA and  has been authorized for detection and/or diagnosis of SARS-CoV-2 by  FDA under an Emergency Use Authorization (EUA). This EUA will remain  in effect (meaning  this test can be used) for the duration of the  Covid-19 declaration under Section 564(b)(1) of the Act, 21  U.S.C. section 360bbb-3(b)(1), unless the authorization is  terminated or revoked. Performed at [facility], [place]  [address], [place], [place] [id number]   Basic metabolic panel     Status: Abnormal   Collection Time: 01/16/20 11:17 PM  Result Value Ref Range   Sodium 137 135 - 145 mmol/L   Potassium 3.9 3.5 - 5.1 mmol/L   Chloride 100 98 - 111 mmol/L   CO2 27 22 - 32 mmol/L   Glucose, Bld 130 (H) 70 - 99 mg/dL    Comment: Glucose reference range applies only to samples taken after fasting for at least 8 hours.   BUN 16 8 - 23 mg/dL   Creatinine, Ser 1.19 0.61 - 1.24 mg/dL   Calcium 8.5 (L) 8.9 - 10.3 mg/dL   GFR calc non Af Amer 59 (L) >60 mL/min   GFR calc Af Amer >60 >60 mL/min   Anion gap 10 5 - 15    Comment: Performed at [facility] [address], [place], [place] [address]  CBC with Differential     Status: Abnormal   Collection Time: 01/16/20 11:17 PM  Result Value Ref Range   WBC 17.7 (H) 4.0 - 10.5 K/uL   RBC 5.15 4.22 - 5.81 MIL/uL   Hemoglobin 16.4 13.0 - 17.0 g/dL   HCT 49.3 39.0 - 52.0 %   MCV 95.7 80.0 - 100.0 fL   MCH 31.8 26.0 - 34.0 pg   MCHC 33.3 30.0 - 36.0 g/dL   RDW 12.5 11.5 - 15.5 %   Platelets 244 150 - 400 K/uL   nRBC 0.0 0.0 - 0.2 %   Neutrophils Relative % 88 %   Neutro Abs 15.6 (H) 1.7 - 7.7 K/uL   Lymphocytes Relative 6 %   Lymphs Abs 1.0 0.7 - 4.0 K/uL   Monocytes Relative 5 %   Monocytes Absolute 0.9 0.1 - 1.0 K/uL   Eosinophils Relative 0 %   Eosinophils Absolute 0.0 0.0 - 0.5 K/uL   Basophils Relative 0 %   Basophils Absolute 0.1 0.0 - 0.1 K/uL   Immature Granulocytes 1 %   Abs Immature Granulocytes 0.11 (H) 0.00 - 0.07 K/uL    Comment: Performed at [facility] [address], [place], [place] [address]  Type and screen [place]     Status: None   Collection Time: 01/16/20 11:34 PM  Result Value Ref Range   ABO/RH(D) O POS    Antibody Screen NEG    Sample Expiration      01/19/2020,2359 Performed at [facility], [place] [address], [place], [place] [address]   ABO/Rh     Status: None   Collection Time: 01/16/20  11:34 PM  Result Value Ref Range   ABO/RH(D)      O POS Performed at [facility] [address], [place], [place] [address]   Surgical pcr screen     Status: None   Collection Time: 01/17/20  5:24 AM   Specimen: Nasal Mucosa; Nasal Swab  Result Value Ref Range   MRSA, PCR NEGATIVE NEGATIVE   Staphylococcus aureus NEGATIVE NEGATIVE    Comment: (NOTE) The Xpert SA Assay (FDA approved for NASAL specimens in patients 27 years of age and older), is one component of a comprehensive surveillance program. It is not intended to diagnose  infection nor to guide or monitor treatment. Performed at [facility], [place] [address] [place], [place] [address]   CBC with Differential/Platelet     Status: Abnormal   Collection Time: 01/17/20  8:06 AM  Result Value Ref Range   WBC 14.7 (H) 4.0 - 10.5 K/uL   RBC 5.04 4.22 - 5.81 MIL/uL   Hemoglobin 16.0 13.0 - 17.0 g/dL   HCT 48.1 39.0 - 52.0 %   MCV 95.4 80.0 - 100.0 fL   MCH 31.7 26.0 - 34.0 pg   MCHC 33.3 30.0 - 36.0 g/dL   RDW 12.6 11.5 - 15.5 %   Platelets 220 150 - 400 K/uL   nRBC 0.0 0.0 - 0.2 %   Neutrophils Relative % 87 %   Neutro Abs 12.7 (H) 1.7 - 7.7 K/uL   Lymphocytes Relative 6 %   Lymphs Abs 0.9 0.7 - 4.0 K/uL   Monocytes Relative 6 %   Monocytes Absolute 0.9 0.1 - 1.0 K/uL   Eosinophils Relative 0 %   Eosinophils Absolute 0.0 0.0 - 0.5 K/uL   Basophils Relative 0 %   Basophils Absolute 0.0 0.0 - 0.1 K/uL   Immature Granulocytes 1 %   Abs Immature Granulocytes 0.07 0.00 - 0.07 K/uL    Comment: Performed at [facility] [address] [place], [place] [address]    DG Chest 1 View  Result Date: 01/16/2020 CLINICAL DATA:  Fall with hip pain EXAM: CHEST  1 VIEW COMPARISON:  03/29/2007 FINDINGS: Right lung is clear. Slightly elevated left diaphragm with atelectasis at the left base. Mild cardiomegaly with aortic atherosclerosis. Negative for pleural effusion or pneumothorax. IMPRESSION: 1. Cardiomegaly  without edema or infiltrate 2. Linear atelectasis or scarring at the left base Electronically Signed   By: Donavan Foil M.D.   On: 01/16/2020 22:41   DG Hip Unilat W or Wo Pelvis 2-3 Views Left  Result Date: 01/16/2020 CLINICAL DATA:  79 year old male with fall and left hip pain. EXAM: DG HIP (WITH OR WITHOUT PELVIS) 2-3V LEFT COMPARISON:  None. FINDINGS: There is a mildly displaced fracture of the left femoral neck. No other acute fracture identified. 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 No dislocation. Degenerative changes of the lower lumbar spine. The soft tissues are unremarkable. Prostate brachytherapy seeds noted. IMPRESSION: Mildly displaced left femoral neck fracture. Electronically Signed   By: Anner Crete M.D.   On: 01/16/2020 22:41    Review of Systems  Unable to perform ROS: Other   Blood pressure (!) 172/76, pulse 64, temperature 98.8 F (37.1 C), temperature source Oral, resp. rate 18, height 6' (1.829 m), weight 93 kg, SpO2 96 %. Physical Exam  Constitutional: He appears well-developed and well-nourished. No distress.  HENT:  Head: Normocephalic and atraumatic.  Eyes: Conjunctivae are normal. Right eye exhibits no discharge. Left eye exhibits no discharge. No scleral icterus.  Cardiovascular: Normal rate and regular rhythm.  Respiratory: Effort normal. No respiratory distress.  Musculoskeletal:     Cervical back: Normal range of motion.     Comments: LLE No traumatic wounds, ecchymosis, or rash  TTP hip  No knee or ankle effusion  Knee stable to varus/ valgus and anterior/posterior stress  Sens DPN, SPN, TN could not assess  Motor EHL, ext, flex, evers grossly intact  DP 1+, PT 0, No significant edema  Neurological: He is alert.  Skin: Skin is warm and dry. He is not diaphoretic.  Psychiatric: He has a normal mood and affect. His  behavior is normal.    Assessment/Plan: Left hip fx -- Plan THA tomorrow with Dr. Percell Miller. Please keep NPO after MN. Multiple medical problems  including hypertension, hyperlipidemia, hard of hearing, prostate cancer, and tobacco abuse -- per primary service    Lisette Abu, PA-C Orthopedic Surgery [phone] 01/17/2020, 9:27 AM

## 2020-01-17 NOTE — H&P (View-Only) (Signed)
Reason for Consult:Left hip fx Referring Physician: Hercules Agen Sparks is an 79 y.o. male.  HPI: Jonathan Sparks fell while trying to get some cigarettes out of his car. He had immediate left hip pain and could not get up or ambulate. He was brought to the ED where x-rays showed a left femoral neck fx and orthopedic surgery was consulted. He is essentially deaf and communication was done through unidirectional writing and him speaking.  Past Medical History:  Diagnosis Date  . Cancer [facility])    prostate cancer   . Hyperlipidemia   . Hypertension     Past Surgical History:  Procedure Laterality Date  . INNER EAR SURGERY    . prostate cancer seed      Family History  Problem Relation Age of Onset  . Alcohol abuse Father   . Heart attack Brother   . Hyperlipidemia Son   . Hypertension Son     Social History:  reports that he has been smoking. He has been smoking about 0.50 packs per day. He has never used smokeless tobacco. He reports that he does not drink alcohol or use drugs.  Allergies: No Known Allergies  Medications: I have reviewed the patient's current medications.  Results for orders placed or performed during the hospital encounter of 01/16/20 (from the past 48 hour(s))  Respiratory Panel by RT PCR (Flu A&B, Covid) - Nasopharyngeal Swab     Status: None   Collection Time: 01/16/20 10:16 PM   Specimen: Nasopharyngeal Swab  Result Value Ref Range   SARS Coronavirus 2 by RT PCR NEGATIVE NEGATIVE    Comment: (NOTE) SARS-CoV-2 target nucleic acids are NOT DETECTED. The SARS-CoV-2 RNA is generally detectable in upper respiratoy specimens during the acute phase of infection. The lowest concentration of SARS-CoV-2 viral copies this assay can detect is 131 copies/mL. A negative result does not preclude SARS-Cov-2 infection and should not be used as the sole basis for treatment or other patient management decisions. A negative result may occur with  improper specimen  collection/handling, submission of specimen other than nasopharyngeal swab, presence of viral mutation(s) within the areas targeted by this assay, and inadequate number of viral copies (<131 copies/mL). A negative result must be combined with clinical observations, patient history, and epidemiological information. The expected result is Negative. Fact Sheet for Patients:  PinkCheek.be Fact Sheet for Healthcare Providers:  GravelBags.it This test is not yet ap proved or cleared by the [place] FDA and  has been authorized for detection and/or diagnosis of SARS-CoV-2 by FDA under an Emergency Use Authorization (EUA). This EUA will remain  in effect (meaning this test can be used) for the duration of the COVID-19 declaration under Section 564(b)(1) of the Act, 21 U.S.C. section 360bbb-3(b)(1), unless the authorization is terminated or revoked sooner.    Influenza A by PCR NEGATIVE NEGATIVE   Influenza B by PCR NEGATIVE NEGATIVE    Comment: (NOTE) The Xpert Xpress SARS-CoV-2/FLU/RSV assay is intended as an aid in  the diagnosis of influenza from Nasopharyngeal swab specimens and  should not be used as a sole basis for treatment. Nasal washings and  aspirates are unacceptable for Xpert Xpress SARS-CoV-2/FLU/RSV  testing. Fact Sheet for Patients: PinkCheek.be Fact Sheet for Healthcare Providers: GravelBags.it This test is not yet approved or cleared by the [place] FDA and  has been authorized for detection and/or diagnosis of SARS-CoV-2 by  FDA under an Emergency Use Authorization (EUA). This EUA will remain  in effect (meaning  this test can be used) for the duration of the  Covid-19 declaration under Section 564(b)(1) of the Act, 21  U.S.C. section 360bbb-3(b)(1), unless the authorization is  terminated or revoked. Performed at [facility], [place]  [address], [place], [place] [id number]   Basic metabolic panel     Status: Abnormal   Collection Time: 01/16/20 11:17 PM  Result Value Ref Range   Sodium 137 135 - 145 mmol/L   Potassium 3.9 3.5 - 5.1 mmol/L   Chloride 100 98 - 111 mmol/L   CO2 27 22 - 32 mmol/L   Glucose, Bld 130 (H) 70 - 99 mg/dL    Comment: Glucose reference range applies only to samples taken after fasting for at least 8 hours.   BUN 16 8 - 23 mg/dL   Creatinine, Ser 1.19 0.61 - 1.24 mg/dL   Calcium 8.5 (L) 8.9 - 10.3 mg/dL   GFR calc non Af Amer 59 (L) >60 mL/min   GFR calc Af Amer >60 >60 mL/min   Anion gap 10 5 - 15    Comment: Performed at [facility] [address], [place], [place] [address]  CBC with Differential     Status: Abnormal   Collection Time: 01/16/20 11:17 PM  Result Value Ref Range   WBC 17.7 (H) 4.0 - 10.5 K/uL   RBC 5.15 4.22 - 5.81 MIL/uL   Hemoglobin 16.4 13.0 - 17.0 g/dL   HCT 49.3 39.0 - 52.0 %   MCV 95.7 80.0 - 100.0 fL   MCH 31.8 26.0 - 34.0 pg   MCHC 33.3 30.0 - 36.0 g/dL   RDW 12.5 11.5 - 15.5 %   Platelets 244 150 - 400 K/uL   nRBC 0.0 0.0 - 0.2 %   Neutrophils Relative % 88 %   Neutro Abs 15.6 (H) 1.7 - 7.7 K/uL   Lymphocytes Relative 6 %   Lymphs Abs 1.0 0.7 - 4.0 K/uL   Monocytes Relative 5 %   Monocytes Absolute 0.9 0.1 - 1.0 K/uL   Eosinophils Relative 0 %   Eosinophils Absolute 0.0 0.0 - 0.5 K/uL   Basophils Relative 0 %   Basophils Absolute 0.1 0.0 - 0.1 K/uL   Immature Granulocytes 1 %   Abs Immature Granulocytes 0.11 (H) 0.00 - 0.07 K/uL    Comment: Performed at [facility] [address], [place], [place] [address]  Type and screen [place]     Status: None   Collection Time: 01/16/20 11:34 PM  Result Value Ref Range   ABO/RH(D) O POS    Antibody Screen NEG    Sample Expiration      01/19/2020,2359 Performed at [facility], [place] [address], [place], [place] [address]   ABO/Rh     Status: None   Collection Time: 01/16/20  11:34 PM  Result Value Ref Range   ABO/RH(D)      O POS Performed at [facility] [address] [place], [place] [address]   Surgical pcr screen     Status: None   Collection Time: 01/17/20  5:24 AM   Specimen: Nasal Mucosa; Nasal Swab  Result Value Ref Range   MRSA, PCR NEGATIVE NEGATIVE   Staphylococcus aureus NEGATIVE NEGATIVE    Comment: (NOTE) The Xpert SA Assay (FDA approved for NASAL specimens in patients 54 years of age and older), is one component of a comprehensive surveillance program. It is not intended to diagnose  infection nor to guide or monitor treatment. Performed at [facility], [place] [address] [place], [place] [address]   CBC with Differential/Platelet     Status: Abnormal   Collection Time: 01/17/20  8:06 AM  Result Value Ref Range   WBC 14.7 (H) 4.0 - 10.5 K/uL   RBC 5.04 4.22 - 5.81 MIL/uL   Hemoglobin 16.0 13.0 - 17.0 g/dL   HCT 48.1 39.0 - 52.0 %   MCV 95.4 80.0 - 100.0 fL   MCH 31.7 26.0 - 34.0 pg   MCHC 33.3 30.0 - 36.0 g/dL   RDW 12.6 11.5 - 15.5 %   Platelets 220 150 - 400 K/uL   nRBC 0.0 0.0 - 0.2 %   Neutrophils Relative % 87 %   Neutro Abs 12.7 (H) 1.7 - 7.7 K/uL   Lymphocytes Relative 6 %   Lymphs Abs 0.9 0.7 - 4.0 K/uL   Monocytes Relative 6 %   Monocytes Absolute 0.9 0.1 - 1.0 K/uL   Eosinophils Relative 0 %   Eosinophils Absolute 0.0 0.0 - 0.5 K/uL   Basophils Relative 0 %   Basophils Absolute 0.0 0.0 - 0.1 K/uL   Immature Granulocytes 1 %   Abs Immature Granulocytes 0.07 0.00 - 0.07 K/uL    Comment: Performed at [facility] [address], [place], [place] [address]    DG Chest 1 View  Result Date: 01/16/2020 CLINICAL DATA:  Fall with hip pain EXAM: CHEST  1 VIEW COMPARISON:  03/29/2007 FINDINGS: Right lung is clear. Slightly elevated left diaphragm with atelectasis at the left base. Mild cardiomegaly with aortic atherosclerosis. Negative for pleural effusion or pneumothorax. IMPRESSION: 1. Cardiomegaly  without edema or infiltrate 2. Linear atelectasis or scarring at the left base Electronically Signed   By: Donavan Foil M.D.   On: 01/16/2020 22:41   DG Hip Unilat W or Wo Pelvis 2-3 Views Left  Result Date: 01/16/2020 CLINICAL DATA:  79 year old male with fall and left hip pain. EXAM: DG HIP (WITH OR WITHOUT PELVIS) 2-3V LEFT COMPARISON:  None. FINDINGS: There is a mildly displaced fracture of the left femoral neck. No other acute fracture identified. 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 No dislocation. Degenerative changes of the lower lumbar spine. The soft tissues are unremarkable. Prostate brachytherapy seeds noted. IMPRESSION: Mildly displaced left femoral neck fracture. Electronically Signed   By: Anner Crete M.D.   On: 01/16/2020 22:41    Review of Systems  Unable to perform ROS: Other   Blood pressure (!) 172/76, pulse 64, temperature 98.8 F (37.1 C), temperature source Oral, resp. rate 18, height 6' (1.829 m), weight 93 kg, SpO2 96 %. Physical Exam  Constitutional: He appears well-developed and well-nourished. No distress.  HENT:  Head: Normocephalic and atraumatic.  Eyes: Conjunctivae are normal. Right eye exhibits no discharge. Left eye exhibits no discharge. No scleral icterus.  Cardiovascular: Normal rate and regular rhythm.  Respiratory: Effort normal. No respiratory distress.  Musculoskeletal:     Cervical back: Normal range of motion.     Comments: LLE No traumatic wounds, ecchymosis, or rash  TTP hip  No knee or ankle effusion  Knee stable to varus/ valgus and anterior/posterior stress  Sens DPN, SPN, TN could not assess  Motor EHL, ext, flex, evers grossly intact  DP 1+, PT 0, No significant edema  Neurological: He is alert.  Skin: Skin is warm and dry. He is not diaphoretic.  Psychiatric: He has a normal mood and affect. His  behavior is normal.    Assessment/Plan: Left hip fx -- Plan THA tomorrow with Dr. Percell Miller. Please keep NPO after MN. Multiple medical problems  including hypertension, hyperlipidemia, hard of hearing, prostate cancer, and tobacco abuse -- per primary service    Lisette Abu, PA-C Orthopedic Surgery [phone] 01/17/2020, 9:27 AM

## 2020-01-17 NOTE — ED Notes (Signed)
Pt was resting comfortably and stated that pain medication was effective

## 2020-01-18 ENCOUNTER — Inpatient Hospital Stay (HOSPITAL_COMMUNITY): Payer: Medicare Other

## 2020-01-18 ENCOUNTER — Inpatient Hospital Stay (HOSPITAL_COMMUNITY): Payer: Medicare Other | Admitting: Anesthesiology

## 2020-01-18 ENCOUNTER — Encounter (HOSPITAL_COMMUNITY): Payer: Self-pay | Admitting: Internal Medicine

## 2020-01-18 ENCOUNTER — Encounter (HOSPITAL_COMMUNITY)
Admission: EM | Disposition: A | Discharge status: Skilled Nursing Facility | Payer: Self-pay | Source: Home / Self Care | Attending: Internal Medicine

## 2020-01-18 HISTORY — PX: TOTAL HIP ARTHROPLASTY: SHX124

## 2020-01-18 LAB — CBC WITH DIFFERENTIAL/PLATELET
Abs Immature Granulocytes: 0.06 10*3/uL (ref 0.00–0.07)
Basophils Absolute: 0 10*3/uL (ref 0.0–0.1)
Basophils Relative: 0 %
Eosinophils Absolute: 0.2 10*3/uL (ref 0.0–0.5)
Eosinophils Relative: 2 %
HCT: 48.6 % (ref 39.0–52.0)
Hemoglobin: 15.9 g/dL (ref 13.0–17.0)
Immature Granulocytes: 1 %
Lymphocytes Relative: 8 %
Lymphs Abs: 1 10*3/uL (ref 0.7–4.0)
MCH: 31.6 pg (ref 26.0–34.0)
MCHC: 32.7 g/dL (ref 30.0–36.0)
MCV: 96.6 fL (ref 80.0–100.0)
Monocytes Absolute: 1.1 10*3/uL — ABNORMAL HIGH (ref 0.1–1.0)
Monocytes Relative: 9 %
Neutro Abs: 10 10*3/uL — ABNORMAL HIGH (ref 1.7–7.7)
Neutrophils Relative %: 80 %
Platelets: 194 10*3/uL (ref 150–400)
RBC: 5.03 MIL/uL (ref 4.22–5.81)
RDW: 12.7 % (ref 11.5–15.5)
WBC: 12.4 10*3/uL — ABNORMAL HIGH (ref 4.0–10.5)
nRBC: 0 % (ref 0.0–0.2)

## 2020-01-18 LAB — BASIC METABOLIC PANEL
Anion gap: 9 (ref 5–15)
BUN: 13 mg/dL (ref 8–23)
CO2: 30 mmol/L (ref 22–32)
Calcium: 8.4 mg/dL — ABNORMAL LOW (ref 8.9–10.3)
Chloride: 100 mmol/L (ref 98–111)
Creatinine, Ser: 1.25 mg/dL — ABNORMAL HIGH (ref 0.61–1.24)
GFR calc Af Amer: >60 mL/min (ref >60)
GFR calc non Af Amer: 55 mL/min — ABNORMAL LOW (ref >60)
Glucose, Bld: 117 mg/dL — ABNORMAL HIGH (ref 70–99)
Potassium: 4.4 mmol/L (ref 3.5–5.1)
Sodium: 139 mmol/L (ref 135–145)

## 2020-01-18 SURGERY — ARTHROPLASTY, HIP, TOTAL, ANTERIOR APPROACH
Anesthesia: General | Site: Hip | Laterality: Left

## 2020-01-18 MED ORDER — PHENYLEPHRINE 40 MCG/ML (10ML) SYRINGE FOR IV PUSH (FOR BLOOD PRESSURE SUPPORT)
PREFILLED_SYRINGE | INTRAVENOUS | Status: AC
  Filled 2020-01-18: qty 10

## 2020-01-18 MED ORDER — ONDANSETRON HCL 4 MG/2ML IJ SOLN
INTRAMUSCULAR | Status: DC | PRN
  Administered 2020-01-18: 4 mg via INTRAVENOUS

## 2020-01-18 MED ORDER — DIPHENHYDRAMINE HCL 12.5 MG/5ML PO ELIX: 12.5000 mg | ORAL_SOLUTION | ORAL | Status: DC | PRN

## 2020-01-18 MED ORDER — SODIUM CHLORIDE FLUSH 0.9 % IV SOLN
INTRAVENOUS | Status: DC | PRN
  Administered 2020-01-18: 30 mL

## 2020-01-18 MED ORDER — KETOROLAC TROMETHAMINE 15 MG/ML IJ SOLN
7.5000 mg | Freq: Four times a day (QID) | INTRAMUSCULAR | Status: AC
  Administered 2020-01-18 – 2020-01-19 (×4): 7.5 mg via INTRAVENOUS
  Filled 2020-01-18 (×4): qty 1

## 2020-01-18 MED ORDER — METOCLOPRAMIDE HCL 5 MG/ML IJ SOLN: 5.0000 mg | Freq: Three times a day (TID) | INTRAMUSCULAR | Status: DC | PRN

## 2020-01-18 MED ORDER — CEFAZOLIN SODIUM-DEXTROSE 1-4 GM/50ML-% IV SOLN
1.0000 g | Freq: Four times a day (QID) | INTRAVENOUS | Status: AC
  Administered 2020-01-18 – 2020-01-19 (×2): 1 g via INTRAVENOUS
  Filled 2020-01-18 (×2): qty 50

## 2020-01-18 MED ORDER — LACTATED RINGERS IV SOLN: INTRAVENOUS | Status: DC

## 2020-01-18 MED ORDER — SUCCINYLCHOLINE CHLORIDE 200 MG/10ML IV SOSY
PREFILLED_SYRINGE | INTRAVENOUS | Status: DC | PRN
  Administered 2020-01-18: 120 mg via INTRAVENOUS

## 2020-01-18 MED ORDER — HYDROCODONE-ACETAMINOPHEN 5-325 MG PO TABS
1.0000 | ORAL_TABLET | ORAL | Status: DC | PRN
  Administered 2020-01-18: 1 via ORAL
  Administered 2020-01-19 – 2020-01-21 (×3): 2 via ORAL
  Filled 2020-01-18: qty 1
  Filled 2020-01-18 (×3): qty 2

## 2020-01-18 MED ORDER — ROCURONIUM BROMIDE 10 MG/ML (PF) SYRINGE
PREFILLED_SYRINGE | INTRAVENOUS | Status: AC
  Filled 2020-01-18: qty 10

## 2020-01-18 MED ORDER — BUPIVACAINE LIPOSOME 1.3 % IJ SUSP
20.0000 mL | Freq: Once | INTRAMUSCULAR | Status: DC
  Filled 2020-01-18: qty 20

## 2020-01-18 MED ORDER — FENTANYL CITRATE (PF) 250 MCG/5ML IJ SOLN
INTRAMUSCULAR | Status: AC
  Filled 2020-01-18: qty 5

## 2020-01-18 MED ORDER — DOCUSATE SODIUM 100 MG PO CAPS
100.0000 mg | ORAL_CAPSULE | Freq: Two times a day (BID) | ORAL | Status: DC
  Administered 2020-01-18 – 2020-01-20 (×3): 100 mg via ORAL
  Filled 2020-01-18 (×4): qty 1

## 2020-01-18 MED ORDER — EPHEDRINE 5 MG/ML INJ
INTRAVENOUS | Status: AC
  Filled 2020-01-18: qty 20

## 2020-01-18 MED ORDER — MORPHINE SULFATE (PF) 2 MG/ML IV SOLN
0.5000 mg | INTRAVENOUS | Status: DC | PRN
  Administered 2020-01-20: 1 mg via INTRAVENOUS
  Filled 2020-01-18: qty 1

## 2020-01-18 MED ORDER — LIDOCAINE 2% (20 MG/ML) 5 ML SYRINGE
INTRAMUSCULAR | Status: AC
  Filled 2020-01-18: qty 5

## 2020-01-18 MED ORDER — ONDANSETRON HCL 4 MG/2ML IJ SOLN
INTRAMUSCULAR | Status: AC
  Filled 2020-01-18: qty 2

## 2020-01-18 MED ORDER — METOCLOPRAMIDE HCL 5 MG PO TABS: 5.0000 mg | ORAL_TABLET | Freq: Three times a day (TID) | ORAL | Status: DC | PRN

## 2020-01-18 MED ORDER — ALBUMIN HUMAN 5 % IV SOLN: INTRAVENOUS | Status: DC | PRN

## 2020-01-18 MED ORDER — MENTHOL 3 MG MT LOZG: 1.0000 | LOZENGE | OROMUCOSAL | Status: DC | PRN

## 2020-01-18 MED ORDER — TRANEXAMIC ACID-NACL 1000-0.7 MG/100ML-% IV SOLN
INTRAVENOUS | Status: AC
  Filled 2020-01-18: qty 100

## 2020-01-18 MED ORDER — SUCCINYLCHOLINE CHLORIDE 200 MG/10ML IV SOSY
PREFILLED_SYRINGE | INTRAVENOUS | Status: AC
  Filled 2020-01-18: qty 10

## 2020-01-18 MED ORDER — FENTANYL CITRATE (PF) 100 MCG/2ML IJ SOLN
INTRAMUSCULAR | Status: DC | PRN
  Administered 2020-01-18 (×2): 50 ug via INTRAVENOUS
  Administered 2020-01-18: 100 ug via INTRAVENOUS
  Administered 2020-01-18: 50 ug via INTRAVENOUS

## 2020-01-18 MED ORDER — EPHEDRINE SULFATE-NACL 50-0.9 MG/10ML-% IV SOSY
PREFILLED_SYRINGE | INTRAVENOUS | Status: DC | PRN
  Administered 2020-01-18: 10 mg via INTRAVENOUS

## 2020-01-18 MED ORDER — TRANEXAMIC ACID-NACL 1000-0.7 MG/100ML-% IV SOLN
1000.0000 mg | Freq: Once | INTRAVENOUS | Status: AC
  Administered 2020-01-18: 20:00:00 1000 mg via INTRAVENOUS
  Filled 2020-01-18: qty 100

## 2020-01-18 MED ORDER — ACETAMINOPHEN 500 MG PO TABS
500.0000 mg | ORAL_TABLET | Freq: Four times a day (QID) | ORAL | Status: AC
  Administered 2020-01-18 – 2020-01-19 (×4): 500 mg via ORAL
  Filled 2020-01-18 (×4): qty 1

## 2020-01-18 MED ORDER — DEXAMETHASONE SODIUM PHOSPHATE 10 MG/ML IJ SOLN
INTRAMUSCULAR | Status: AC
  Filled 2020-01-18: qty 2

## 2020-01-18 MED ORDER — ENOXAPARIN SODIUM 40 MG/0.4ML ~~LOC~~ SOLN
40.0000 mg | SUBCUTANEOUS | Status: DC
  Administered 2020-01-19 – 2020-01-21 (×3): 40 mg via SUBCUTANEOUS
  Filled 2020-01-18 (×3): qty 0.4

## 2020-01-18 MED ORDER — PROPOFOL 10 MG/ML IV BOLUS
INTRAVENOUS | Status: AC
  Filled 2020-01-18: qty 20

## 2020-01-18 MED ORDER — PHENOL 1.4 % MT LIQD: 1.0000 | OROMUCOSAL | Status: DC | PRN

## 2020-01-18 MED ORDER — HYDROMORPHONE HCL 1 MG/ML IJ SOLN: 0.2500 mg | INTRAMUSCULAR | Status: DC | PRN

## 2020-01-18 MED ORDER — SODIUM CHLORIDE (PF) 0.9 % IJ SOLN
INTRAMUSCULAR | Status: AC
  Filled 2020-01-18: qty 50

## 2020-01-18 MED ORDER — METHOCARBAMOL 500 MG PO TABS
500.0000 mg | ORAL_TABLET | Freq: Four times a day (QID) | ORAL | Status: DC | PRN
  Administered 2020-01-19: 500 mg via ORAL
  Filled 2020-01-18: qty 1

## 2020-01-18 MED ORDER — PHENYLEPHRINE 40 MCG/ML (10ML) SYRINGE FOR IV PUSH (FOR BLOOD PRESSURE SUPPORT)
PREFILLED_SYRINGE | INTRAVENOUS | Status: DC | PRN
  Administered 2020-01-18 (×2): 80 ug via INTRAVENOUS
  Administered 2020-01-18: 200 ug via INTRAVENOUS
  Administered 2020-01-18: 160 ug via INTRAVENOUS

## 2020-01-18 MED ORDER — 0.9 % SODIUM CHLORIDE (POUR BTL) OPTIME
TOPICAL | Status: DC | PRN
  Administered 2020-01-18: 1000 mL

## 2020-01-18 MED ORDER — DEXAMETHASONE SODIUM PHOSPHATE 10 MG/ML IJ SOLN
10.0000 mg | Freq: Once | INTRAMUSCULAR | Status: AC
  Administered 2020-01-19: 10 mg via INTRAVENOUS
  Filled 2020-01-18: qty 1

## 2020-01-18 MED ORDER — POLYETHYLENE GLYCOL 3350 17 G PO PACK: 17.0000 g | PACK | Freq: Every day | ORAL | Status: DC | PRN

## 2020-01-18 MED ORDER — METHOCARBAMOL 1000 MG/10ML IJ SOLN
500.0000 mg | Freq: Four times a day (QID) | INTRAVENOUS | Status: DC | PRN
  Filled 2020-01-18: qty 5

## 2020-01-18 MED ORDER — PROMETHAZINE HCL 25 MG/ML IJ SOLN: 6.2500 mg | INTRAMUSCULAR | Status: DC | PRN

## 2020-01-18 MED ORDER — ACETAMINOPHEN 325 MG PO TABS: 325.0000 mg | ORAL_TABLET | Freq: Four times a day (QID) | ORAL | Status: DC | PRN

## 2020-01-18 MED ORDER — ONDANSETRON HCL 4 MG PO TABS: 4.0000 mg | ORAL_TABLET | Freq: Four times a day (QID) | ORAL | Status: DC | PRN

## 2020-01-18 MED ORDER — DEXAMETHASONE SODIUM PHOSPHATE 10 MG/ML IJ SOLN
INTRAMUSCULAR | Status: DC | PRN
  Administered 2020-01-18: 10 mg via INTRAVENOUS

## 2020-01-18 MED ORDER — ONDANSETRON HCL 4 MG/2ML IJ SOLN: 4.0000 mg | Freq: Four times a day (QID) | INTRAMUSCULAR | Status: DC | PRN

## 2020-01-18 MED ORDER — LIDOCAINE 2% (20 MG/ML) 5 ML SYRINGE
INTRAMUSCULAR | Status: DC | PRN
  Administered 2020-01-18: 100 mg via INTRAVENOUS

## 2020-01-18 MED ORDER — PROPOFOL 10 MG/ML IV BOLUS
INTRAVENOUS | Status: DC | PRN
  Administered 2020-01-18: 100 mg via INTRAVENOUS

## 2020-01-18 MED ORDER — BUPIVACAINE LIPOSOME 1.3 % IJ SUSP
INTRAMUSCULAR | Status: DC | PRN
  Administered 2020-01-18: 20 mL

## 2020-01-18 MED ORDER — ROCURONIUM BROMIDE 50 MG/5ML IV SOSY
PREFILLED_SYRINGE | INTRAVENOUS | Status: DC | PRN
  Administered 2020-01-18: 50 mg via INTRAVENOUS

## 2020-01-18 MED ORDER — SUGAMMADEX SODIUM 200 MG/2ML IV SOLN
INTRAVENOUS | Status: DC | PRN
  Administered 2020-01-18: 100 mg via INTRAVENOUS

## 2020-01-18 SURGICAL SUPPLY — 54 items
BLADE SAG 18X100X1.27 (BLADE) IMPLANT
CLSR STERI-STRIP ANTIMIC 1/2X4 (GAUZE/BANDAGES/DRESSINGS) ×2 IMPLANT
COVER PERINEAL POST (MISCELLANEOUS) ×2 IMPLANT
COVER SURGICAL LIGHT HANDLE (MISCELLANEOUS) ×2 IMPLANT
COVER WAND RF STERILE (DRAPES) ×2 IMPLANT
DRAPE C-ARM 42X72 X-RAY (DRAPES) ×2 IMPLANT
DRAPE STERI IOBAN 125X83 (DRAPES) ×2 IMPLANT
DRAPE U-SHAPE 47X51 STRL (DRAPES) IMPLANT
DRSG MEPILEX BORDER 4X8 (GAUZE/BANDAGES/DRESSINGS) ×2 IMPLANT
DURAPREP 26ML APPLICATOR (WOUND CARE) ×2 IMPLANT
ELECT BLADE 4.0 EZ CLEAN MEGAD (MISCELLANEOUS) ×2
ELECT REM PT RETURN 9FT ADLT (ELECTROSURGICAL) ×2
ELECTRODE BLDE 4.0 EZ CLN MEGD (MISCELLANEOUS) ×1 IMPLANT
ELECTRODE REM PT RTRN 9FT ADLT (ELECTROSURGICAL) ×1 IMPLANT
FACESHIELD WRAPAROUND (MASK) ×4 IMPLANT
FACESHIELD WRAPAROUND OR TEAM (MASK) ×2 IMPLANT
GLOVE BIO SURGEON STRL SZ7.5 (GLOVE) ×4 IMPLANT
GLOVE BIOGEL PI IND STRL 8 (GLOVE) ×2 IMPLANT
GLOVE BIOGEL PI INDICATOR 8 (GLOVE) ×2
GOWN STRL REUS W/ TWL LRG LVL3 (GOWN DISPOSABLE) ×2 IMPLANT
GOWN STRL REUS W/TWL LRG LVL3 (GOWN DISPOSABLE) ×4
HEAD BIOLOX HIP 36/-2.5 (Joint) IMPLANT
HIP BIOLOX HD 36/-2.5 (Joint) ×2 IMPLANT
INSERT TRIDENT POLY 36MM 0DEG (Insert) ×1 IMPLANT
KIT BASIN OR (CUSTOM PROCEDURE TRAY) ×2 IMPLANT
KIT TURNOVER KIT B (KITS) ×2 IMPLANT
MANIFOLD NEPTUNE II (INSTRUMENTS) ×2 IMPLANT
NDL 18GX1X1/2 (RX/OR ONLY) (NEEDLE) IMPLANT
NEEDLE 18GX1X1/2 (RX/OR ONLY) (NEEDLE) IMPLANT
NEEDLE HYPO 22GX1.5 SAFETY (NEEDLE) ×2 IMPLANT
NS IRRIG 1000ML POUR BTL (IV SOLUTION) ×2 IMPLANT
PACK TOTAL JOINT (CUSTOM PROCEDURE TRAY) ×2 IMPLANT
PAD ARMBOARD 7.5X6 YLW CONV (MISCELLANEOUS) ×2 IMPLANT
SCREW HEX LP 6.5X20 (Screw) ×1 IMPLANT
SHELL CLUSTERHOLE ACETABULAR 5 (Shell) ×1 IMPLANT
SPONGE LAP 18X18 RF (DISPOSABLE) IMPLANT
STEM ACCOLADE II SZ8 (Stem) ×1 IMPLANT
SUT MNCRL AB 4-0 PS2 18 (SUTURE) ×2 IMPLANT
SUT STRATAFIX 1PDS 45CM VIOLET (SUTURE) ×2 IMPLANT
SUT VIC AB 0 CT1 27 (SUTURE) ×4
SUT VIC AB 0 CT1 27XBRD ANBCTR (SUTURE) ×2 IMPLANT
SUT VIC AB 1 CT1 27 (SUTURE) ×4
SUT VIC AB 1 CT1 27XBRD ANBCTR (SUTURE) ×2 IMPLANT
SUT VIC AB 2-0 CT1 27 (SUTURE) ×4
SUT VIC AB 2-0 CT1 TAPERPNT 27 (SUTURE) ×2 IMPLANT
SYR 20ML LL LF (SYRINGE) IMPLANT
SYR 50ML LL SCALE MARK (SYRINGE) ×4 IMPLANT
SYR BULB IRRIGATION 50ML (SYRINGE) ×2 IMPLANT
TOWEL GREEN STERILE (TOWEL DISPOSABLE) ×2 IMPLANT
TOWEL GREEN STERILE FF (TOWEL DISPOSABLE) ×2 IMPLANT
TRAY CATH 16FR W/PLASTIC CATH (SET/KITS/TRAYS/PACK) IMPLANT
TRAY FOLEY MTR SLVR 16FR STAT (SET/KITS/TRAYS/PACK) IMPLANT
WATER STERILE IRR 1000ML POUR (IV SOLUTION) ×2 IMPLANT
YANKAUER SUCT BULB TIP NO VENT (SUCTIONS) ×4 IMPLANT

## 2020-01-18 NOTE — Progress Notes (Signed)
PROGRESS NOTE  Jonathan Sparks YCX:[id number] DOB: 1941/03/12 DOA: 01/16/2020 PCP: Baruch Gouty, FNP  HPI/Recap of past 24 hours:  Uneventful night,  Patient remains in the OR, per RN, surgery got delayed,    Assessment/Plan: Principal Problem:   Closed left hip fracture, initial encounter [facility]) Active Problems:   Essential hypertension with goal blood pressure less than 130/80   Mixed hyperlipidemia  Left hip fracture status post mechanical fall  -Ortho consulted, surgery today  Leukocytosis, reactive, does not appear septic, chest x-ray unremarkable, UA no bacteria, no fever Wbc trending down, repeat cbc in am   Hypertension, blood pressure elevated, hold lisinopril perioperatively, start low-dose Lopressor with holding parameter Plan of resuming lisinopril post op  Prostate cancer, chart review in remission, was released from urology, PSA in December 2020 was unremarkable  Hearing impairment, family to bring in Games developer for hearing aids  DVT Prophylaxis: sCDs for now in anticipating surgery, post op DVT prophylaxis per ortho  Code Status: Full  Family Communication: son [name] who is HPOA over the phone on 3/3  Disposition Plan:    Patient came from:    home                                                                                                      Anticipated d/c place: likely will need SNF post op, family agrees  Barriers to d/c OR conditions which need to be met to effect a safe d/c: surgery planned on 3/4, will need Pt eval, likely will needs snf   Consultants:  ortho  Procedures:  None  Antibiotics:  None   Objective: BP 124/73 (BP Location: Left Arm)   Pulse 98   Temp (!) 97 F (36.1 C)   Resp 19   Ht 6' 0.01" (1.829 m)   Wt 93 kg   SpO2 97%   BMI 27.80 kg/m   Intake/Output Summary (Last 24 hours) at 01/18/2020 1741 Last data filed at 01/18/2020 1716 Gross per 24 hour  Intake 1560 ml  Output 1200 ml  Net 360 ml   Filed  Weights   01/16/20 2200 01/18/20 1238  Weight: 93 kg 93 kg    Exam: Patient still in the OR  Data Reviewed: Basic Metabolic Panel: Recent Labs  Lab 01/16/20 2317 01/17/20 0806 01/18/20 0412  NA 137 137 139  K 3.9 4.2 4.4  CL 100 97* 100  CO2 27 31 30   GLUCOSE 130* 122* 117*  BUN 16 16 13   CREATININE 1.19 1.15 1.25*  CALCIUM 8.5* 8.4* 8.4*   Liver Function Tests: No results for input(s): AST, ALT, ALKPHOS, BILITOT, PROT, ALBUMIN in the last 168 hours. No results for input(s): LIPASE, AMYLASE in the last 168 hours. No results for input(s): AMMONIA in the last 168 hours. CBC: Recent Labs  Lab 01/16/20 2317 01/17/20 0806 01/18/20 0412  WBC 17.7* 14.7* 12.4*  NEUTROABS 15.6* 12.7* 10.0*  HGB 16.4 16.0 15.9  HCT 49.3 48.1 48.6  MCV 95.7 95.4 96.6  PLT 244 220 194   Cardiac Enzymes:   No  results for input(s): CKTOTAL, CKMB, CKMBINDEX, TROPONINI in the last 168 hours. BNP (last 3 results) No results for input(s): BNP in the last 8760 hours.  ProBNP (last 3 results) No results for input(s): PROBNP in the last 8760 hours.  CBG: No results for input(s): GLUCAP in the last 168 hours.  Recent Results (from the past 240 hour(s))  Respiratory Panel by RT PCR (Flu A&B, Covid) - Nasopharyngeal Swab     Status: None   Collection Time: 01/16/20 10:16 PM   Specimen: Nasopharyngeal Swab  Result Value Ref Range Status   SARS Coronavirus 2 by RT PCR NEGATIVE NEGATIVE Final    Comment: (NOTE) SARS-CoV-2 target nucleic acids are NOT DETECTED. The SARS-CoV-2 RNA is generally detectable in upper respiratoy specimens during the acute phase of infection. The lowest concentration of SARS-CoV-2 viral copies this assay can detect is 131 copies/mL. A negative result does not preclude SARS-Cov-2 infection and should not be used as the sole basis for treatment or other patient management decisions. A negative result may occur with  improper specimen collection/handling, submission of  specimen other than nasopharyngeal swab, presence of viral mutation(s) within the areas targeted by this assay, and inadequate number of viral copies (<131 copies/mL). A negative result must be combined with clinical observations, patient history, and epidemiological information. The expected result is Negative. Fact Sheet for Patients:  PinkCheek.be Fact Sheet for Healthcare Providers:  GravelBags.it This test is not yet ap proved or cleared by the [place] FDA and  has been authorized for detection and/or diagnosis of SARS-CoV-2 by FDA under an Emergency Use Authorization (EUA). This EUA will remain  in effect (meaning this test can be used) for the duration of the COVID-19 declaration under Section 564(b)(1) of the Act, 21 U.S.C. section 360bbb-3(b)(1), unless the authorization is terminated or revoked sooner.    Influenza A by PCR NEGATIVE NEGATIVE Final   Influenza B by PCR NEGATIVE NEGATIVE Final    Comment: (NOTE) The Xpert Xpress SARS-CoV-2/FLU/RSV assay is intended as an aid in  the diagnosis of influenza from Nasopharyngeal swab specimens and  should not be used as a sole basis for treatment. Nasal washings and  aspirates are unacceptable for Xpert Xpress SARS-CoV-2/FLU/RSV  testing. Fact Sheet for Patients: PinkCheek.be Fact Sheet for Healthcare Providers: GravelBags.it This test is not yet approved or cleared by the [place] FDA and  has been authorized for detection and/or diagnosis of SARS-CoV-2 by  FDA under an Emergency Use Authorization (EUA). This EUA will remain  in effect (meaning this test can be used) for the duration of the  Covid-19 declaration under Section 564(b)(1) of the Act, 21  U.S.C. section 360bbb-3(b)(1), unless the authorization is  terminated or revoked. Performed at [facility], [place] [address], [place],  [place] [address]   Surgical pcr screen     Status: None   Collection Time: 01/17/20  5:24 AM   Specimen: Nasal Mucosa; Nasal Swab  Result Value Ref Range Status   MRSA, PCR NEGATIVE NEGATIVE Final   Staphylococcus aureus NEGATIVE NEGATIVE Final    Comment: (NOTE) The Xpert SA Assay (FDA approved for NASAL specimens in patients 88 years of age and older), is one component of a comprehensive surveillance program. It is not intended to diagnose infection nor to guide or monitor treatment. Performed at [facility], [place] [address] [place], [place] [address]      Studies: DG C-Arm 1-60 Min  Result Date: 01/18/2020 CLINICAL DATA:  Total left hip arthroplasty EXAM: DG C-ARM 1-60 MIN; OPERATIVE LEFT HIP WITH PELVIS COMPARISON:  01/16/2020 FINDINGS: Changes of left hip replacement. Normal AP alignment. No hardware bony complicating feature. Radiation seeds in the region of the prostate. IMPRESSION: Left hip replacement.  No visible complicating feature. Electronically Signed   By: Rolm Baptise M.D.   On: 01/18/2020 16:54   DG HIP OPERATIVE UNILAT W OR W/O PELVIS LEFT  Result Date: 01/18/2020 CLINICAL DATA:  Total left hip arthroplasty EXAM: DG C-ARM 1-60 MIN; OPERATIVE LEFT HIP WITH PELVIS COMPARISON:  01/16/2020 FINDINGS: Changes of left hip replacement. Normal AP alignment. No hardware bony complicating feature. Radiation seeds in the region of the prostate. IMPRESSION: Left hip replacement.  No visible complicating feature. Electronically Signed   By: Rolm Baptise M.D.   On: 01/18/2020 16:54    Scheduled Meds: . [MAR Hold] acetaminophen  1,000 mg Oral Q8H  . bupivacaine liposome  20 mL Infiltration Once  . feeding supplement  296 mL Oral Once  . [MAR Hold] metoprolol tartrate  12.5 mg Oral BID  . povidone-iodine  2 application Topical Once    Continuous Infusions: . lactated ringers 10 mL/hr at 01/18/20 1240      I have personally reviewed and interpreted on  01/18/2020 daily labs,  tele strips, imagings as discussed above under date review session and assessment and plans.  I reviewed all nursing notes, pharmacy notes, consultant notes,  vitals, pertinent old records  I have discussed plan of care as described above with RN , patient  on 01/18/2020   Florencia Reasons MD, PhD, FACP  [facility]  Available via Epic secure chat 7am-7pm for nonurgent issues Please page for urgent issues, pager number available through [email or web address] .   01/18/2020, 5:41 PM  LOS: 2 days

## 2020-01-18 NOTE — Discharge Instructions (Signed)
  Left Total Hip Replacement for fracture: You may bear weight as tolerated. Keep your dressing on and dry until follow up. Take medicine to prevent blood clots as directed. Stop Smoking.  Smoking increases your risk of infection and decreases your body's ability to heal. Take pain medicine as needed with the goal of transitioning to over the counter medicines.  Stop as needed pain medication as soon as you are able.  DIET:  As you were doing prior to hospitalization, we recommend a well-balanced diet.  DRESSING / WOUND CARE / SHOWERING You may shower 3 days after surgery, but keep the wounds dry during showering.  You may use an occlusive plastic wrap (Press'n Seal for example) with blue painter's tape at edges, NO SOAKING/SUBMERGING IN THE BATHTUB.  If the bandage gets wet, change with a clean dry gauze.  If the incision gets wet, pat the wound dry with a clean towel.  ACTIVITY o Increase activity slowly as tolerated, but follow the weight bearing instructions below.   o No driving for 6 weeks or until further direction given by your physician.  You cannot drive while taking narcotics.  o No lifting or carrying greater than 10 lbs. until further directed by your surgeon. o Avoid periods of inactivity such as sitting longer than an hour when not asleep. This helps prevent blood clots.  o You may return to work once you are authorized by your doctor.   WEIGHT BEARING  Weight bearing as tolerated with assist device (walker, cane, etc) as directed, use it as long as suggested by your surgeon or therapist, typically 4-6 weeks.  PRECAUTIONS:  If you experience chest pain or shortness of breath - call 911 immediately for transfer to the hospital emergency department.                                                  FOLLOW-UP APPOINTMENTS:  If you do not already have a post-op appointment, please call the office for an appointment to be seen by your surgeon.  2 weeks after surgery.  MAKE SURE  YOU:  . Understand these instructions.  . Get help right away if you are not doing well or get worse.   Thank you for letting us be a part of your medical care team.  It is a privilege we respect greatly.  We hope these instructions will help you stay on track for a fast and full recovery!

## 2020-01-18 NOTE — Transfer of Care (Signed)
Immediate Anesthesia Transfer of Care Note  Patient: Jonathan Sparks  Procedure(s) Performed: TOTAL HIP ARTHROPLASTY ANTERIOR APPROACH (Left Hip)  Patient Location: PACU  Anesthesia Type:General  Level of Consciousness: drowsy and patient cooperative  Airway & Oxygen Therapy: Patient Spontanous Breathing and Patient connected to nasal cannula oxygen  Post-op Assessment: Report given to RN and Post -op Vital signs reviewed and stable  Post vital signs: Reviewed and stable  Last Vitals:  Vitals Value Taken Time  BP 131/62 01/18/20 1721  Temp    Pulse 116 01/18/20 1723  Resp 19 01/18/20 1723  SpO2 91 % 01/18/20 1723  Vitals shown include unvalidated device data.  Last Pain:  Vitals:   01/18/20 0845  TempSrc: Oral  PainSc:          Complications: No apparent anesthesia complications

## 2020-01-18 NOTE — Anesthesia Procedure Notes (Signed)
Procedure Name: Intubation Date/Time: 01/18/2020 3:09 PM Performed by: Lance Coon, CRNA Pre-anesthesia Checklist: Patient identified, Emergency Drugs available, Suction available, Patient being monitored and Timeout performed Patient Re-evaluated:Patient Re-evaluated prior to induction Oxygen Delivery Method: Circle system utilized Preoxygenation: Pre-oxygenation with 100% oxygen Induction Type: IV induction Ventilation: Mask ventilation without difficulty Laryngoscope Size: Glidescope and 4 Grade View: Grade I Tube type: Oral Tube size: 7.5 mm Number of attempts: 1 Airway Equipment and Method: Stylet and Video-laryngoscopy Placement Confirmation: ETT inserted through vocal cords under direct vision,  positive ETCO2 and breath sounds checked- equal and bilateral Secured at: 22 cm Tube secured with: Tape Difficulty Due To: Difficulty was anticipated and Difficult Airway- due to anterior larynx

## 2020-01-18 NOTE — Progress Notes (Signed)
Pt received from PACU with a left hip, dressing clean,dry and intact.

## 2020-01-18 NOTE — Interval H&P Note (Signed)
I participated in the care of this patient and agree with the above history, physical and evaluation. I performed a review of the history and a physical exam as detailed   Srihaan Mastrangelo Daniel Mackenze Grandison MD  

## 2020-01-18 NOTE — Anesthesia Preprocedure Evaluation (Addendum)
Anesthesia Evaluation  Patient identified by MRN, date of birth, ID band Patient awake    Reviewed: Allergy & Precautions, NPO status , Patient's Chart, lab work & pertinent test results  Airway Mallampati: IV  TM Distance: >3 FB Neck ROM: Full    Dental no notable dental hx.    Pulmonary Current Smoker,    Pulmonary exam normal breath sounds clear to auscultation       Cardiovascular hypertension, Pt. on medications and Pt. on home beta blockers Normal cardiovascular exam Rhythm:Regular Rate:Normal     Neuro/Psych negative neurological ROS  negative psych ROS   GI/Hepatic negative GI ROS, Neg liver ROS,   Endo/Other  negative endocrine ROS  Renal/GU negative Renal ROS  negative genitourinary   Musculoskeletal negative musculoskeletal ROS (+)   Abdominal   Peds negative pediatric ROS (+)  Hematology negative hematology ROS (+)   Anesthesia Other Findings   Reproductive/Obstetrics negative OB ROS                            Anesthesia Physical Anesthesia Plan  ASA: II  Anesthesia Plan: General   Post-op Pain Management:    Induction: Intravenous  PONV Risk Score and Plan: 2 and Ondansetron, Dexamethasone and Treatment may vary due to age or medical condition  Airway Management Planned: Oral ETT and Video Laryngoscope Planned  Additional Equipment:   Intra-op Plan:   Post-operative Plan: Extubation in OR  Informed Consent: I have reviewed the patients History and Physical, chart, labs and discussed the procedure including the risks, benefits and alternatives for the proposed anesthesia with the patient or authorized representative who has indicated his/her understanding and acceptance.     Dental advisory given  Plan Discussed with: CRNA and Surgeon  Anesthesia Plan Comments:        Anesthesia Quick Evaluation

## 2020-01-19 LAB — CBC
HCT: 41.4 % (ref 39.0–52.0)
Hemoglobin: 13.8 g/dL (ref 13.0–17.0)
MCH: 31.7 pg (ref 26.0–34.0)
MCHC: 33.3 g/dL (ref 30.0–36.0)
MCV: 95 fL (ref 80.0–100.0)
Platelets: 182 10*3/uL (ref 150–400)
RBC: 4.36 MIL/uL (ref 4.22–5.81)
RDW: 12.6 % (ref 11.5–15.5)
WBC: 18.4 10*3/uL — ABNORMAL HIGH (ref 4.0–10.5)
nRBC: 0 % (ref 0.0–0.2)

## 2020-01-19 LAB — BASIC METABOLIC PANEL
Anion gap: 9 (ref 5–15)
BUN: 17 mg/dL (ref 8–23)
CO2: 28 mmol/L (ref 22–32)
Calcium: 8.3 mg/dL — ABNORMAL LOW (ref 8.9–10.3)
Chloride: 99 mmol/L (ref 98–111)
Creatinine, Ser: 1.23 mg/dL (ref 0.61–1.24)
GFR calc Af Amer: >60 mL/min (ref >60)
GFR calc non Af Amer: 56 mL/min — ABNORMAL LOW (ref >60)
Glucose, Bld: 202 mg/dL — ABNORMAL HIGH (ref 70–99)
Potassium: 4.2 mmol/L (ref 3.5–5.1)
Sodium: 136 mmol/L (ref 135–145)

## 2020-01-19 MED ORDER — HYDROCODONE-ACETAMINOPHEN 5-325 MG PO TABS: 1.0000 | ORAL_TABLET | Freq: Four times a day (QID) | ORAL | 0 refills | Status: AC | PRN

## 2020-01-19 MED ORDER — ESCITALOPRAM OXALATE 10 MG PO TABS
10.0000 mg | ORAL_TABLET | Freq: Every day | ORAL | Status: DC
  Administered 2020-01-19 – 2020-01-21 (×3): 10 mg via ORAL
  Filled 2020-01-19 (×3): qty 1

## 2020-01-19 MED ORDER — ATORVASTATIN CALCIUM 40 MG PO TABS
40.0000 mg | ORAL_TABLET | Freq: Every day | ORAL | Status: DC
  Administered 2020-01-19 – 2020-01-20 (×2): 40 mg via ORAL
  Filled 2020-01-19 (×2): qty 1

## 2020-01-19 MED ORDER — ENOXAPARIN SODIUM 40 MG/0.4ML ~~LOC~~ SOLN: 40.0000 mg | SUBCUTANEOUS | 0 refills | Status: DC

## 2020-01-19 MED ORDER — LISINOPRIL 10 MG PO TABS
10.0000 mg | ORAL_TABLET | Freq: Every day | ORAL | Status: DC
  Administered 2020-01-19 – 2020-01-21 (×3): 10 mg via ORAL
  Filled 2020-01-19 (×3): qty 1

## 2020-01-19 MED ORDER — LACTATED RINGERS IV SOLN: INTRAVENOUS | Status: AC

## 2020-01-19 NOTE — NC FL2 (Signed)
[place] LEVEL OF CARE SCREENING TOOL     IDENTIFICATION  Patient Name: Jonathan Sparks Birthdate: 1941-04-01 Sex: male Admission Date (Current Location): 01/16/2020  [facility] [place] Number:      Facility and Address:         Provider Number:    Attending Physician Name and Address:  Florencia Reasons, MD  Relative Name and Phone Number:  Wa Rumpf [phone]    Current Level of Care: SNF Recommended Level of Care: [place] Prior Approval Number:    Date Approved/Denied:   PASRR Number: [id number]  Discharge Plan: SNF    Current Diagnoses: Patient Active Problem List   Diagnosis Date Noted  . Closed left hip fracture, initial encounter ([place]) 01/16/2020  . Peripheral vascular disease with stasis dermatitis 11/03/2019  . Xerosis cutis 11/03/2019  . Vitamin D deficiency 11/03/2019  . Recurrent major depressive disorder, in full remission ([place]) 02/07/2018  . Prostate cancer ([place]) 11/04/2016  . Essential hypertension with goal blood pressure less than 130/80 08/05/2016  . Anxiety, generalized 06/14/2014  . Mixed hyperlipidemia 06/14/2014    Orientation RESPIRATION BLADDER Height & Weight     Self, Time, Situation, Place  O2 Continent, External catheter Weight: 205 lb (93 kg) Height:  6' 0.01" (182.9 cm)  BEHAVIORAL SYMPTOMS/MOOD NEUROLOGICAL BOWEL NUTRITION STATUS      Continent Diet(see discharge summary)  AMBULATORY STATUS COMMUNICATION OF NEEDS Skin   Extensive Assist Verbally Normal                       Personal Care Assistance Level of Assistance  Bathing, Feeding, Dressing Bathing Assistance: Maximum assistance Feeding assistance: Independent Dressing Assistance: Maximum assistance     Functional Limitations Info             SPECIAL CARE FACTORS FREQUENCY  PT (By licensed PT), OT (By licensed OT)     PT Frequency: 5 times a week OT Frequency: 5 times a week            Contractures       Additional Factors Info                  Current Medications (01/19/2020):  This is the current hospital active medication list Current Facility-Administered Medications  Medication Dose Route Frequency Provider Last Rate Last Admin  . acetaminophen (TYLENOL) tablet 325-650 mg  325-650 mg Oral Q6H PRN Prudencio Burly III, PA-C      . bupivacaine liposome (EXPAREL) 1.3 % injection 266 mg  20 mL Infiltration Once Prudencio Burly III, PA-C      . diphenhydrAMINE (BENADRYL) 12.5 MG/5ML elixir 12.5-25 mg  12.5-25 mg Oral Q4H PRN Prudencio Burly III, PA-C      . docusate sodium (COLACE) capsule 100 mg  100 mg Oral BID Prudencio Burly III, PA-C   100 mg at 01/19/20 0945  . enoxaparin (LOVENOX) injection 40 mg  40 mg Subcutaneous Q24H Prudencio Burly III, PA-C   40 mg at 01/19/20 0945  . hydrALAZINE (APRESOLINE) injection 10 mg  10 mg Intravenous Q4H PRN Prudencio Burly III, PA-C      . HYDROcodone-acetaminophen (NORCO/VICODIN) 5-325 MG per tablet 1-2 tablet  1-2 tablet Oral Q4H PRN Prudencio Burly III, PA-C   1 tablet at 01/18/20 2104  . lactated ringers infusion   Intravenous Continuous Florencia Reasons, MD      . menthol-cetylpyridinium (CEPACOL) lozenge 3 mg  1 lozenge  Oral PRN Prudencio Burly III, PA-C       Or  . phenol (CHLORASEPTIC) mouth spray 1 spray  1 spray Mouth/Throat PRN Prudencio Burly III, PA-C      . methocarbamol (ROBAXIN) tablet 500 mg  500 mg Oral Q6H PRN Prudencio Burly III, PA-C       Or  . methocarbamol (ROBAXIN) 500 mg in dextrose 5 % 50 mL IVPB  500 mg Intravenous Q6H PRN Prudencio Burly III, PA-C      . metoCLOPramide (REGLAN) tablet 5-10 mg  5-10 mg Oral Q8H PRN Prudencio Burly III, PA-C       Or  . metoCLOPramide (REGLAN) injection 5-10 mg  5-10 mg Intravenous Q8H PRN Prudencio Burly III, PA-C      . morphine 2 MG/ML injection 0.5-1 mg  0.5-1 mg Intravenous Q4H PRN  Prudencio Burly III, PA-C      . ondansetron (ZOFRAN) tablet 4 mg  4 mg Oral Q6H PRN Prudencio Burly III, PA-C       Or  . ondansetron (ZOFRAN) injection 4 mg  4 mg Intravenous Q6H PRN Prudencio Burly III, PA-C      . polyethylene glycol (MIRALAX / GLYCOLAX) packet 17 g  17 g Oral Daily PRN Prudencio Burly III, PA-C         Discharge Medications: Please see discharge summary for a list of discharge medications.  Relevant Imaging Results:  Relevant Lab Results:   Additional Information SS# [id number]  Atilano Median, LCSW

## 2020-01-19 NOTE — TOC Initial Note (Addendum)
Transition of Care [facility]) - Initial/Assessment Note    Patient Details  Name: Jonathan Sparks MRN: [id number] Date of Birth: 1941-04-15  Transition of Care [facility]) CM/SW Contact:    Atilano Median, LCSW Phone Number: 01/19/2020, 1:46 PM  Clinical Narrative:               Admitted with history of hypertension, hyperlipidemia, hard of hearing, prostate cancer and a fall when he was trying to reach to get a pack of cigarettes in his car.     CSW spoke with patient's son [name] in regards to dispo plan. PT recommending SNF. [name] is aware and agreeable stating he will not be able to be at home with the patient during the day.   Plan is for patient to go to SNF short term, then return home with son.   CSW given permission to send information out. Waiting on bed offers. TOC will continue to follow. Will need updated COVID. Will ask unit RN to collect today.   Auth initiated on 3/5. No facility chosen at this time. No current bed offers.   Expected Discharge Plan: [place] Barriers to Discharge: No SNF bed, Continued Medical Work up   Patient Goals and CMS Choice Patient states their goals for this hospitalization and ongoing recovery are:: get better to return home CMS Medicare.gov Compare Post Acute Care list provided to:: Patient Represenative (must comment)(Kevin Lecuyer) Choice offered to / list presented to : Adult Children  Expected Discharge Plan and Services Expected Discharge Plan: [place] In-house Referral: Clinical Social Work   Post Acute Care Choice: [place] Living arrangements for the past 2 months: [place]                                      Prior Living Arrangements/Services Living arrangements for the past 2 months: Single Family Home Lives with:: Self, Adult Children   Do you feel safe going back to the place where you live?: No   needs to regain strength to be alone  Need for Family  Participation in Patient Care: Yes (Comment) Care giver support system in place?: Yes (comment)      Activities of Daily Living Home Assistive Devices/Equipment: Blood pressure cuff ADL Screening (condition at time of admission) Patient's cognitive ability adequate to safely complete daily activities?: Yes Is the patient deaf or have difficulty hearing?: Yes Does the patient have difficulty seeing, even when wearing glasses/contacts?: No Does the patient have difficulty concentrating, remembering, or making decisions?: No Patient able to express need for assistance with ADLs?: Yes Does the patient have difficulty dressing or bathing?: Yes Independently performs ADLs?: No Does the patient have difficulty walking or climbing stairs?: Yes Weakness of Legs: Left Weakness of Arms/Hands: None  Permission Sought/Granted                  Emotional Assessment       Orientation: : Oriented to Self, Oriented to  Time, Oriented to Place, Oriented to Situation      Admission diagnosis:  Pre-op examination [Z01.818] Closed fracture of left hip, initial encounter ([place]) [S72.002A] Fall, initial encounter [W19.XXXA] Closed left hip fracture, initial encounter [facility]) [S72.002A] Patient Active Problem List   Diagnosis Date Noted  . Closed left hip fracture, initial encounter ([place]) 01/16/2020  . Peripheral vascular disease with stasis dermatitis 11/03/2019  . Xerosis cutis 11/03/2019  .  Vitamin D deficiency 11/03/2019  . Recurrent major depressive disorder, in full remission ([place]) 02/07/2018  . Prostate cancer ([place]) 11/04/2016  . Essential hypertension with goal blood pressure less than 130/80 08/05/2016  . Anxiety, generalized 06/14/2014  . Mixed hyperlipidemia 06/14/2014   PCP:  Baruch Gouty, FNP Pharmacy:   [place], [place] [address] Phone: [phone] Fax: [place], [place]  [place] [address] Phone: [phone] Fax: [phone]     Social Determinants of Health (SDOH) Interventions    Readmission Risk Interventions No flowsheet data found.

## 2020-01-19 NOTE — Anesthesia Postprocedure Evaluation (Signed)
Anesthesia Post Note  Patient: Jonathan Sparks  Procedure(s) Performed: TOTAL HIP ARTHROPLASTY ANTERIOR APPROACH (Left Hip)     Patient location during evaluation: PACU Anesthesia Type: General Level of consciousness: awake and alert Pain management: pain level controlled Vital Signs Assessment: post-procedure vital signs reviewed and stable Respiratory status: spontaneous breathing, nonlabored ventilation and respiratory function stable Cardiovascular status: blood pressure returned to baseline and stable Postop Assessment: no apparent nausea or vomiting Anesthetic complications: no    Last Vitals:  Vitals:   01/19/20 0422 01/19/20 0825  BP: 115/64 (!) 142/68  Pulse: 64 62  Resp: 18 17  Temp: 36.7 C (!) 36.3 C  SpO2: 98% 97%    Last Pain:  Vitals:   01/19/20 1200  TempSrc:   PainSc: 2                  Lidia Collum

## 2020-01-19 NOTE — Progress Notes (Addendum)
PROGRESS NOTE  Jonathan Sparks ZOX:[id number] DOB: 10/24/1941 DOA: 01/16/2020 PCP: Baruch Gouty, FNP  HPI/Recap of past 24 hours:  POD#1 Uneventful night,  He is very hard of hearing ,even with hearing aids on Communication through writing   Assessment/Plan: Principal Problem:   Closed left hip fracture, initial encounter ([place]) Active Problems:   Essential hypertension with goal blood pressure less than 130/80   Mixed hyperlipidemia  Left hip fracture status post mechanical fall  -TOTAL HIP ARTHROPLASTY ANTERIOR APPROACH (Left) by Dr. Ernesta Amble. 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 Ernesta Amble. Murphy on 01/18/2020  Antibiotics:  Perioperative Ancef on March 4   Objective: BP (!) 142/68 (BP Location: Right Arm)   Pulse 62   Temp (!) 97.4 F (36.3 C) (Oral)   Resp 17   Ht 6' 0.01" (1.829 m)   Wt 93 kg   SpO2 97%    BMI 27.80 kg/m   Intake/Output Summary (Last 24 hours) at 01/19/2020 1000 Last data filed at 01/19/2020 0500 Gross per 24 hour  Intake 2417.9 ml  Output 1800 ml  Net 617.9 ml   Filed Weights   01/16/20 2200 01/18/20 1238  Weight: 93 kg 93 kg    Exam:   General:  NAD, pleasant ,very hard of hearing  Cardiovascular: RRR  Respiratory: CTABL  Abdomen: Soft/ND/NT, positive BS  Musculoskeletal: Left hip postop changes, neurovascular is intact distally  Neuro: alert, oriented    Data Reviewed: Basic Metabolic Panel: Recent Labs  Lab 01/16/20 2317 01/17/20 0806 01/18/20 0412  NA 137 137 139  K 3.9 4.2 4.4  CL 100 97* 100  CO2 27 31 30   GLUCOSE 130* 122* 117*  BUN 16 16 13   CREATININE 1.19 1.15 1.25*  CALCIUM 8.5* 8.4* 8.4*   Liver Function Tests: No results for input(s): AST, ALT, ALKPHOS, BILITOT, PROT, ALBUMIN in the last 168 hours. No results for input(s): LIPASE, AMYLASE in the last 168 hours.  No results for input(s): AMMONIA in the last 168 hours. CBC: Recent Labs  Lab 01/16/20 2317 01/17/20 0806 01/18/20 0412  WBC 17.7* 14.7* 12.4*  NEUTROABS 15.6* 12.7* 10.0*  HGB 16.4 16.0 15.9  HCT 49.3 48.1 48.6  MCV 95.7 95.4 96.6  PLT 244 220 194   Cardiac Enzymes:   No results for input(s): CKTOTAL, CKMB, CKMBINDEX, TROPONINI in the last 168 hours. BNP (last 3 results) No results for input(s): BNP in the last 8760 hours.  ProBNP (last 3 results) No results for input(s): PROBNP in the last 8760 hours.  CBG: No results for input(s): GLUCAP in the last 168 hours.  Recent Results (from the past 240 hour(s))  Respiratory Panel by RT PCR (Flu A&B, Covid) - Nasopharyngeal Swab     Status: None   Collection Time: 01/16/20 10:16 PM   Specimen: Nasopharyngeal Swab  Result Value Ref Range Status   SARS Coronavirus 2 by RT PCR NEGATIVE NEGATIVE Final    Comment: (NOTE) SARS-CoV-2 target nucleic acids are NOT DETECTED. The SARS-CoV-2 RNA is generally  detectable in upper respiratoy specimens during the acute phase of infection. The lowest concentration of SARS-CoV-2 viral copies this assay can detect is 131 copies/mL. A negative result does not preclude SARS-Cov-2 infection and should not be used as the sole basis for treatment or other patient management decisions. A negative result may occur with  improper specimen collection/handling, submission of specimen other than nasopharyngeal swab, presence of viral mutation(s) within the areas targeted by this assay, and inadequate number of viral copies (<131 copies/mL). A negative result must be combined with clinical observations, patient history, and epidemiological information. The expected result is Negative. Fact Sheet for Patients:  PinkCheek.be Fact Sheet for Healthcare Providers:  GravelBags.it This test is not yet ap proved or cleared by the [place] FDA and  has been authorized for detection and/or diagnosis of SARS-CoV-2 by FDA under an Emergency Use Authorization (EUA). This EUA will remain  in effect (meaning this test can be used) for the duration of the COVID-19 declaration under Section 564(b)(1) of the Act, 21 U.S.C. section 360bbb-3(b)(1), unless the authorization is terminated or revoked sooner.    Influenza A by PCR NEGATIVE NEGATIVE Final   Influenza B by PCR NEGATIVE NEGATIVE Final    Comment: (NOTE) The Xpert Xpress SARS-CoV-2/FLU/RSV assay is intended as an aid in  the diagnosis of influenza from Nasopharyngeal swab specimens and  should not be used as a sole basis for treatment. Nasal washings and  aspirates are unacceptable for Xpert Xpress SARS-CoV-2/FLU/RSV  testing. Fact Sheet for Patients: PinkCheek.be Fact Sheet for Healthcare Providers: GravelBags.it This test is not yet approved or cleared by the [place] FDA and  has been  authorized for detection and/or diagnosis of SARS-CoV-2 by  FDA under an Emergency Use Authorization (EUA). This EUA will remain  in effect (meaning this test can be used) for the duration of the  Covid-19 declaration under Section 564(b)(1) of the Act, 21  U.S.C. section 360bbb-3(b)(1), unless the authorization is  terminated or revoked. Performed at [facility], [place] [address], [place], [place] [address]   Surgical pcr screen     Status: None   Collection Time: 01/17/20  5:24 AM   Specimen: Nasal Mucosa; Nasal Swab  Result Value Ref Range Status   MRSA, PCR NEGATIVE NEGATIVE Final   Staphylococcus aureus NEGATIVE NEGATIVE Final    Comment: (NOTE) The Xpert SA Assay (FDA approved for NASAL specimens in  patients 53 years of age and older), is one component of a comprehensive surveillance program. It is not intended to diagnose infection nor to guide or monitor treatment. Performed at [facility], [place] [address], [place], [place] [address]      Studies: DG C-Arm 1-60 Min  Result Date: 01/18/2020 CLINICAL DATA:  Total left hip arthroplasty EXAM: DG C-ARM 1-60 MIN; OPERATIVE LEFT HIP WITH PELVIS COMPARISON:  01/16/2020 FINDINGS: Changes of left hip replacement. Normal AP alignment. No hardware bony complicating feature. Radiation seeds in the region of the prostate. IMPRESSION: Left hip replacement.  No visible complicating feature. Electronically Signed   By: Rolm Baptise M.D.   On: 01/18/2020 16:54   DG HIP OPERATIVE UNILAT W OR W/O PELVIS LEFT  Result Date: 01/18/2020 CLINICAL DATA:  Total left hip arthroplasty EXAM: DG C-ARM 1-60 MIN; OPERATIVE LEFT HIP WITH PELVIS COMPARISON:  01/16/2020 FINDINGS: Changes of left hip replacement. Normal AP alignment. No hardware bony complicating feature. Radiation seeds in the region of the prostate. IMPRESSION: Left hip replacement.  No visible complicating feature. Electronically Signed   By: Rolm Baptise M.D.   On: 01/18/2020 16:54     Scheduled Meds: . acetaminophen  500 mg Oral Q6H  . bupivacaine liposome  20 mL Infiltration Once  . docusate sodium  100 mg Oral BID  . enoxaparin (LOVENOX) injection  40 mg Subcutaneous Q24H  . ketorolac  7.5 mg Intravenous Q6H  . metoprolol tartrate  12.5 mg Oral BID    Continuous Infusions: . lactated ringers 100 mL/hr at 01/18/20 1939  . methocarbamol (ROBAXIN) IV      Time spent :35 minutes  I have personally reviewed and interpreted on  01/19/2020 daily labs, tele strips, imagings as discussed above under date review session and assessment and plans.  I reviewed all nursing notes, pharmacy notes, consultant notes,  vitals, pertinent old records  I have discussed plan of care as described above with RN , patient  on 01/19/2020   Florencia Reasons MD, PhD, FACP  [facility]  Available via Epic secure chat 7am-7pm for nonurgent issues Please page for urgent issues, pager number available through [email or web address] .   01/19/2020, 10:00 AM  LOS: 3 days

## 2020-01-19 NOTE — Plan of Care (Signed)
  Problem: Education: Goal: Knowledge of General Education information will improve Description: Including pain rating scale, medication(s)/side effects and non-pharmacologic comfort measures Outcome: Progressing   Problem: Pain Managment: Goal: General experience of comfort will improve Outcome: Progressing   

## 2020-01-19 NOTE — Evaluation (Signed)
Physical Therapy Evaluation Patient Details Name: Jonathan Sparks MRN: [id number] DOB: [birth year] Today's Date: 01/19/2020   History of Present Illness  Pt is a 79 y/o male admitted after a fall at home in which he sustained a L femoral neck fx. Pt is now s/p L THA direct anterior approach. PMH including but not limited to HTN, tobacco use, HLD and prostate cancer.    Clinical Impression  Pt presented supine in bed with HOB elevated, awake and willing to participate in therapy session. Prior to admission, pt reported that he was independent with all functional mobility and ADLs. Pt lives with his son in a single level home with two steps to enter. Pt's son is gone for most of the day for work; pt reporting that his son only comes home to sleep. At the time of evaluation, pt moving well for bed mobility and was able to perform transfers with min A and RW. However, pt unable to tolerate taking any steps this session secondary to pain. Based on pt's current functional mobility status and limited support at home, currently recommend that pt d/c to SNF for further rehab services prior to returning home again. Pt would continue to benefit from skilled physical therapy services at this time while admitted and after d/c to address the below listed limitations in order to improve overall safety and independence with functional mobility.     Follow Up Recommendations SNF    Equipment Recommendations  Rolling walker with 5" wheels;3in1 (PT)    Recommendations for Other Services       Precautions / Restrictions Precautions Precautions: Fall Restrictions Weight Bearing Restrictions: Yes LLE Weight Bearing: Weight bearing as tolerated      Mobility  Bed Mobility Overal bed mobility: Needs Assistance Bed Mobility: Supine to Sit     Supine to sit: Min guard     General bed mobility comments: increased time needed, use of bed rails, HOB elevated  Transfers Overall transfer level: Needs  assistance Equipment used: Rolling walker (2 wheeled) Transfers: Sit to/from [facility] Sit to Stand: Min assist Stand pivot transfers: Min assist       General transfer comment: increased time, use of momentum, min A to power into standing and for stability with pivotal movement to chair towards pt's R side  Ambulation/Gait             General Gait Details: pt unable to tolerate taking steps this session  Stairs            Wheelchair Mobility    Modified Rankin (Stroke Patients Only)       Balance Overall balance assessment: Needs assistance;History of Falls Sitting-balance support: Feet supported Sitting balance-Leahy Scale: Fair     Standing balance support: Bilateral upper extremity supported;Single extremity supported Standing balance-Leahy Scale: Poor                               Pertinent Vitals/Pain Pain Assessment: Faces Faces Pain Scale: Hurts little more Pain Location: L hip Pain Descriptors / Indicators: Guarding Pain Intervention(s): Monitored during session;Repositioned    Home Living Family/patient expects to be discharged to:: Private residence Living Arrangements: Children Available Help at Discharge: Family;Available PRN/intermittently Type of Home: House Home Access: Stairs to enter Entrance Stairs-Rails: Psychiatric nurse of Steps: 2 Home Layout: One level Home Equipment: Cane - single point      Prior Function Level of Independence: Independent  Comments: drives     Hand Dominance        Extremity/Trunk Assessment   Upper Extremity Assessment Upper Extremity Assessment: Generalized weakness    Lower Extremity Assessment Lower Extremity Assessment: LLE deficits/detail LLE Deficits / Details: pt with decreased strength and ROM limitations secondary to post-op pain and weakness       Communication   Communication: HOH;Other (comment)(has cochlear implant,  does not help)  Cognition Arousal/Alertness: Awake/alert Behavior During Therapy: WFL for tasks assessed/performed Overall Cognitive Status: Within Functional Limits for tasks assessed                                 General Comments: difficult to fully assess secondary to significant HOH; however, pt able to follow commands (via gestures or written) and mimic movements      General Comments      Exercises General Exercises - Lower Extremity Ankle Circles/Pumps: AROM;Both;20 reps;Seated Long Arc Quad: AROM;Strengthening;Both;10 reps;Seated Hip Flexion/Marching: AROM;Strengthening;Both;10 reps;Seated   Assessment/Plan    PT Assessment Patient needs continued PT services  PT Problem List Decreased strength;Decreased range of motion;Decreased activity tolerance;Decreased balance;Decreased mobility;Decreased coordination;Decreased knowledge of use of DME;Decreased safety awareness;Decreased knowledge of precautions;Pain       PT Treatment Interventions Stair training;DME instruction;Gait training;Functional mobility training;Therapeutic exercise;Therapeutic activities;Balance training;Neuromuscular re-education;Patient/family education    PT Goals (Current goals can be found in the Care Plan section)  Acute Rehab PT Goals Patient Stated Goal: return to PLOF PT Goal Formulation: With patient Time For Goal Achievement: 02/02/20 Potential to Achieve Goals: Good    Frequency Min 3X/week   Barriers to discharge        Co-evaluation               AM-PAC PT "6 Clicks" Mobility  Outcome Measure Help needed turning from your back to your side while in a flat bed without using bedrails?: None Help needed moving from lying on your back to sitting on the side of a flat bed without using bedrails?: None Help needed moving to and from a bed to a chair (including a wheelchair)?: A Little Help needed standing up from a chair using your arms (e.g., wheelchair or bedside  chair)?: A Little Help needed to walk in hospital room?: A Lot Help needed climbing 3-5 steps with a railing? : Total 6 Click Score: 17    End of Session Equipment Utilized During Treatment: Gait belt Activity Tolerance: Patient tolerated treatment well Patient left: in chair;with call bell/phone within reach;with chair alarm set Nurse Communication: Mobility status PT Visit Diagnosis: Other abnormalities of gait and mobility (R26.89);Pain Pain - Right/Left: Left Pain - part of body: Hip    Time: [id number] PT Time Calculation (min) (ACUTE ONLY): 30 min   Charges:   PT Evaluation $PT Eval Moderate Complexity: 1 Mod PT Treatments $Therapeutic Activity: 8-22 mins        Anastasio Champion, DPT  Acute Rehabilitation Services Pager [phone] Office [place] 01/19/2020, 10:04 AM

## 2020-01-19 NOTE — Clinical Social Work Note (Signed)
Attention [employer]  Please contact Roselee Nova [phone] Monday-Friday 8am-5pm or Vallery Ridge [phone] 8am-5pm with authorization details.

## 2020-01-19 NOTE — Progress Notes (Signed)
    Subjective: Patient reports pain as mild.   Tolerating diet.  No CP, SOB.   No weakness/dizziness.  Not yet mobilized.  Objective:   VITALS:   Vitals:   01/18/20 1955 01/18/20 2104 01/18/20 2356 01/19/20 0422  BP: (!) 141/77  120/65 115/64  Pulse: 100 99 92 64  Resp: 20  18 18   Temp: 98.2 F (36.8 C)  98.6 F (37 C) 98.1 F (36.7 C)  TempSrc: Oral  Oral Oral  SpO2: 93%  96% 98%  Weight:      Height:       CBC Latest Ref Rng & Units 01/18/2020 01/17/2020 01/16/2020  WBC 4.0 - 10.5 K/uL 12.4(H) 14.7(H) 17.7(H)  Hemoglobin 13.0 - 17.0 g/dL 15.9 16.0 16.4  Hematocrit 39.0 - 52.0 % 48.6 48.1 49.3  Platelets 150 - 400 K/uL 194 220 244   BMP Latest Ref Rng & Units 01/18/2020 01/17/2020 01/16/2020  Glucose 70 - 99 mg/dL 117(H) 122(H) 130(H)  BUN 8 - 23 mg/dL 13 16 16   Creatinine 0.61 - 1.24 mg/dL 1.25(H) 1.15 1.19  BUN/Creat Ratio 10 - 24 - - -  Sodium 135 - 145 mmol/L 139 137 137  Potassium 3.5 - 5.1 mmol/L 4.4 4.2 3.9  Chloride 98 - 111 mmol/L 100 97(L) 100  CO2 22 - 32 mmol/L 30 31 27   Calcium 8.9 - 10.3 mg/dL 8.4(L) 8.4(L) 8.5(L)   Intake/Output      03/04 0701 - 03/05 0700 03/05 0701 - 03/06 0700   P.O. 240    I.V. (mL/kg) 1577.9 (17)    IV Piggyback 600    Total Intake(mL/kg) 2417.9 (26)    Urine (mL/kg/hr) 1200 (0.5)    Blood 600    Total Output 1800    Net +617.9             Physical Exam: General: NAD.  Upright in bed.  Calm, conversant.  Very hard of hearing.  No increased work of breathing. MSK LLE: Feet warm Neurovascularly intact Sensation intact distally Dorsiflexion/Plantar flexion intact Incision: dressing C/D/I   Assessment: 1 Day Post-Op  S/P Procedure(s) (LRB): TOTAL HIP ARTHROPLASTY ANTERIOR APPROACH (Left) by Dr. Ernesta Amble. Percell Miller on 01/18/2020  Principal Problem:   Closed left hip fracture, initial encounter [facility]) Active Problems:   Essential hypertension with goal blood pressure less than 130/80   Mixed hyperlipidemia  Closed left  femoral neck fracture, status post left total hip arthroplasty Doing well postop day 1 Tolerating diet and voiding Pain controlled Not yet mobilized  Plan: Up with therapy Incentive Spirometry Apply ice PRN  Weightbearing: WBAT LLE Insicional and dressing care: Dressings left intact until follow-up Showering: Keep dressing dry VTE prophylaxis: Lovenox 40mg  qd 30 days, SCDs, ambulation Pain control: Minimize narcotics.  Continue current regimen. Follow - up plan: 2 weeks Contact information:  Edmonia Lynch MD, Roxan Hockey PA-C  Dispo: TBD.  Therapy evaluations pending.  Discharge when mobilized and ready medically.  Prudencio Burly III, PA-C 01/19/2020, 7:47 AM

## 2020-01-20 DIAGNOSIS — H9190 Unspecified hearing loss, unspecified ear: Secondary | ICD-10-CM

## 2020-01-20 LAB — CBC
HCT: 39 % (ref 39.0–52.0)
Hemoglobin: 13 g/dL (ref 13.0–17.0)
MCH: 31.7 pg (ref 26.0–34.0)
MCHC: 33.3 g/dL (ref 30.0–36.0)
MCV: 95.1 fL (ref 80.0–100.0)
Platelets: 195 10*3/uL (ref 150–400)
RBC: 4.1 MIL/uL — ABNORMAL LOW (ref 4.22–5.81)
RDW: 12.5 % (ref 11.5–15.5)
WBC: 19.6 10*3/uL — ABNORMAL HIGH (ref 4.0–10.5)
nRBC: 0 % (ref 0.0–0.2)

## 2020-01-20 LAB — BASIC METABOLIC PANEL
Anion gap: 9 (ref 5–15)
BUN: 24 mg/dL — ABNORMAL HIGH (ref 8–23)
CO2: 28 mmol/L (ref 22–32)
Calcium: 8.1 mg/dL — ABNORMAL LOW (ref 8.9–10.3)
Chloride: 100 mmol/L (ref 98–111)
Creatinine, Ser: 1.1 mg/dL (ref 0.61–1.24)
GFR calc Af Amer: >60 mL/min (ref >60)
GFR calc non Af Amer: >60 mL/min (ref >60)
Glucose, Bld: 128 mg/dL — ABNORMAL HIGH (ref 70–99)
Potassium: 4.4 mmol/L (ref 3.5–5.1)
Sodium: 137 mmol/L (ref 135–145)

## 2020-01-20 LAB — SARS CORONAVIRUS 2 (TAT 6-24 HRS): SARS Coronavirus 2: NEGATIVE

## 2020-01-20 MED ORDER — SENNOSIDES-DOCUSATE SODIUM 8.6-50 MG PO TABS
1.0000 | ORAL_TABLET | Freq: Two times a day (BID) | ORAL | Status: DC
  Administered 2020-01-20 – 2020-01-21 (×3): 1 via ORAL
  Filled 2020-01-20 (×3): qty 1

## 2020-01-20 MED ORDER — LACTATED RINGERS IV SOLN: INTRAVENOUS | Status: DC

## 2020-01-20 MED ORDER — WHITE PETROLATUM EX OINT
TOPICAL_OINTMENT | CUTANEOUS | Status: AC
  Filled 2020-01-20: qty 28.35

## 2020-01-20 NOTE — Plan of Care (Signed)
  Problem: Skin Integrity: Goal: Risk for impaired skin integrity will decrease Outcome: Progressing   Problem: Safety: Goal: Ability to remain free from injury will improve Outcome: Progressing   Problem: Pain Managment: Goal: General experience of comfort will improve Outcome: Progressing   Problem: Elimination: Goal: Will not experience complications related to bowel motility Outcome: Progressing   Problem: Nutrition: Goal: Adequate nutrition will be maintained Outcome: Progressing   Problem: Clinical Measurements: Goal: Will remain free from infection Outcome: Progressing   Problem: Education: Goal: Knowledge of General Education information will improve Description: Including pain rating scale, medication(s)/side effects and non-pharmacologic comfort measures Outcome: Progressing

## 2020-01-20 NOTE — TOC Progression Note (Signed)
Transition of Care [facility]) - Progression Note    Patient Details  Name: Jonathan Sparks MRN: [id number] Date of Birth: 1940/12/02  Transition of Care [facility]) CM/SW [place], [place] Phone Number: [phone] 01/20/2020, 9:07 AM  Clinical Narrative:     CSW reached out to patient's son to let him know about bed offers. CSW had to leave message.   CSW will continue to follow for discharge planning needs.   Expected Discharge Plan: [place] Barriers to Discharge: No SNF bed, Continued Medical Work up  Expected Discharge Plan and Services Expected Discharge Plan: [place] In-house Referral: Clinical Social Work   Post Acute Care Choice: [place] Living arrangements for the past 2 months: Single Family Home                                       Social Determinants of Health (SDOH) Interventions    Readmission Risk Interventions No flowsheet data found.

## 2020-01-20 NOTE — Social Work (Signed)
CSW faxed clinicals to [facility], Reference authorization [phone].  Criss Alvine, [place]

## 2020-01-20 NOTE — Progress Notes (Signed)
   ORTHOPEDIC PROGRESS NOTE  S/P TOTAL HIP ARTHROPLASTY ANTERIOR APPROACH (Left) on 01/18/2020 by Dr. Percell Miller  Subjective: Patient reports minimal pain about the operative sight. Resting comfortably in bed.   Tolerating diet.  No CP, SOB.   No weakness/dizziness.    Objective:   VITALS:   Vitals:   01/19/20 0825 01/19/20 1942 01/20/20 0344 01/20/20 0839  BP: (!) 142/68 (!) 134/58 (!) 144/88 (!) 136/58  Pulse: 62 60 62 64  Resp: 17 18 20 18   Temp: (!) 97.4 F (36.3 C) 98.3 F (36.8 C) 98.2 F (36.8 C) 98.2 F (36.8 C)  TempSrc: Oral Oral Oral Oral  SpO2: 97% 97% 97% 97%  Weight:      Height:       CBC Latest Ref Rng & Units 01/20/2020 01/19/2020 01/18/2020  WBC 4.0 - 10.5 K/uL 19.6(H) 18.4(H) 12.4(H)  Hemoglobin 13.0 - 17.0 g/dL 13.0 13.8 15.9  Hematocrit 39.0 - 52.0 % 39.0 41.4 48.6  Platelets 150 - 400 K/uL 195 182 194   BMP Latest Ref Rng & Units 01/20/2020 01/19/2020 01/18/2020  Glucose 70 - 99 mg/dL 128(H) 202(H) 117(H)  BUN 8 - 23 mg/dL 24(H) 17 13  Creatinine 0.61 - 1.24 mg/dL 1.10 1.23 1.25(H)  BUN/Creat Ratio 10 - 24 - - -  Sodium 135 - 145 mmol/L 137 136 139  Potassium 3.5 - 5.1 mmol/L 4.4 4.2 4.4  Chloride 98 - 111 mmol/L 100 99 100  CO2 22 - 32 mmol/L 28 28 30   Calcium 8.9 - 10.3 mg/dL 8.1(L) 8.3(L) 8.4(L)   Intake/Output      03/05 0701 - 03/06 0700 03/06 0701 - 03/07 0700   P.O. 240 240   I.V. (mL/kg) 395.1 (4.2)    IV Piggyback     Total Intake(mL/kg) 635.1 (6.8) 240 (2.6)   Urine (mL/kg/hr) 900 (0.4)    Stool 0    Blood     Total Output 900    Net -264.9 +240           Physical Exam: General: NAD.  Upright in bed.  Very hard of hearing.  No acute distress MSK LLE: incision CDI, leg lengths equal, intact EHL/TA/GSC, warm well perfused foot   Assessment: 2 Days Post-Op  S/P Procedure(s) (LRB): TOTAL HIP ARTHROPLASTY ANTERIOR APPROACH (Left) by Dr. Ernesta Amble. Percell Miller on 01/18/2020  Principal Problem:   Closed left hip fracture, initial encounter  [facility]) Active Problems:   Essential hypertension with goal blood pressure less than 130/80   Mixed hyperlipidemia  Closed left femoral neck fracture, status post left total hip arthroplasty Doing well postop day 2 Tolerating diet and voiding Pain controlled Worked with therapy yesterday  Plan: Continue working with PT Incentive Spirometry Apply ice PRN  Weightbearing: WBAT LLE Insicional and dressing care: Dressings left intact until follow-up Showering: Keep dressing dry VTE prophylaxis: Lovenox 40mg  qd 30 days, SCDs, ambulation Pain control: Minimize narcotics.  Continue current regimen. Follow - up plan: 2 weeks Contact information:  Edmonia Lynch MD, Roxan Hockey PA-C  Dispo: PT recommending SNF. TOC following. Discharge when mobilized and ready medically.  Ethelda Chick, PA-C 01/20/2020, 10:48 AM

## 2020-01-20 NOTE — TOC Progression Note (Signed)
Transition of Care [facility]) - Progression Note    Patient Details  Name: Jonathan Sparks MRN: [id number] Date of Birth: 07/15/1941  Transition of Care [facility]) CM/SW [place], [place] Phone Number: [phone] 01/20/2020, 12:54 PM  Clinical Narrative:     CSW spoke with patient's son and he chose [facility]. CSW followed up with [facility] and they stated they had a bed ready.  CSW followed up with [employer] about insurance authorization and they indicated one had not been started. CSW started insurance authorization and will follow up tomorrow on authorization.  TOC team will continue to follow for discharge planning needs.  Expected Discharge Plan: [place] Barriers to Discharge: No SNF bed, Continued Medical Work up  Expected Discharge Plan and Services Expected Discharge Plan: [place] In-house Referral: Clinical Social Work   Post Acute Care Choice: [place] Living arrangements for the past 2 months: Single Family Home                                       Social Determinants of Health (SDOH) Interventions    Readmission Risk Interventions No flowsheet data found.

## 2020-01-20 NOTE — Progress Notes (Signed)
PROGRESS NOTE  Jonathan Sparks Such [id number] DOB: 01-13-1941 DOA: 01/16/2020 PCP: Baruch Gouty, FNP  HPI/Recap of past 24 hours:  POD#2 Uneventful night,  He is very hard of hearing ,even with hearing aids on Communication through writing   Assessment/Plan: Principal Problem:   Closed left hip fracture, initial encounter ([place]) Active Problems:   Essential hypertension with goal blood pressure less than 130/80   Mixed hyperlipidemia  Left hip fracture status post mechanical fall  -TOTAL HIP ARTHROPLASTY ANTERIOR APPROACH (Left) by Dr. Ernesta Amble. 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 Ernesta Amble. [place] on 01/18/2020  Antibiotics:  Perioperative Ancef on March 4   Objective: BP (!) 136/58 (BP Location: Right Arm) Comment: nurse notified  Pulse 64   Temp 98.2 F (36.8  C) (Oral)   Resp 18   Ht 6' 0.01" (1.829 m)   Wt 93 kg   SpO2 97%   BMI 27.80 kg/m   Intake/Output Summary (Last 24 hours) at 01/20/2020 1402 Last data filed at 01/20/2020 1250 Gross per 24 hour  Intake 1115.09 ml  Output 1125 ml  Net -9.91 ml   Filed Weights   01/16/20 2200 01/18/20 1238  Weight: 93 kg 93 kg    Exam: Patient is examined daily including today on 01/20/20   exams remain the same as of yesterday except that has changed    General:  NAD, pleasant ,very hard of hearing  Cardiovascular: RRR  Respiratory: CTABL  Abdomen: Soft/ND/NT, positive BS  Musculoskeletal: Left hip postop changes, neurovascular is intact distally  Neuro: alert, oriented    Data Reviewed: Basic Metabolic Panel: Recent Labs  Lab 01/16/20 2317 01/17/20 0806 01/18/20 0412 01/19/20 1019 01/20/20 0440  NA 137 137 139 136 137  K 3.9 4.2 4.4 4.2 4.4  CL 100 97* 100 99 100  CO2 _0 GLUCOSE 130* 122* 117* 202* 128*  BUN 16  _0 24*  CREATININE 1.19 1.15 1.25* 1.23 1.10  CALCIUM 8.5* 8.4* 8.4* 8.3* 8.1*   Liver Function Tests: No results for input(s): AST, ALT, ALKPHOS, BILITOT, PROT, ALBUMIN in the last 168 hours. No results for input(s): LIPASE, AMYLASE in the last 168 hours. No results for input(s): AMMONIA in the last 168 hours. CBC: Recent Labs  Lab 01/16/20 2317 01/17/20 0806 01/18/20 0412 01/19/20 1019 01/20/20 0440  WBC 17.7* 14.7* 12.4* 18.4* 19.6*  NEUTROABS 15.6* 12.7* 10.0*  --   --   HGB 16.4 16.0 15.9 13.8 13.0  HCT 49.3 48.1 48.6 41.4 39.0  MCV 95.7 95.4 96.6 95.0 95.1  PLT 244 220 194 182 195   Cardiac Enzymes:   No results for input(s): CKTOTAL, CKMB, CKMBINDEX, TROPONINI in the last 168 hours. BNP (last 3 results) No results for input(s): BNP in the last 8760 hours.  ProBNP (last 3 results) No results for input(s): PROBNP in the last 8760 hours.  CBG: No results for input(s): GLUCAP in the last 168 hours.  Recent Results (from  the past 240 hour(s))  Respiratory Panel by RT PCR (Flu A&B, Covid) - Nasopharyngeal Swab     Status: None   Collection Time: 01/16/20 10:16 PM   Specimen: Nasopharyngeal Swab  Result Value Ref Range Status   SARS Coronavirus 2 by RT PCR NEGATIVE NEGATIVE Final    Comment: (NOTE) SARS-CoV-2 target nucleic acids are NOT DETECTED. The SARS-CoV-2 RNA is generally detectable in upper respiratoy specimens during the acute phase of infection. The lowest concentration of SARS-CoV-2 viral copies this assay can detect is 131 copies/mL. A negative result does not preclude SARS-Cov-2 infection and should not be used as the sole basis for treatment or other patient management decisions. A negative result may occur with  improper specimen collection/handling, submission of specimen other than nasopharyngeal swab, presence of viral mutation(s) within the areas targeted by this assay, and inadequate number of viral copies (<131 copies/mL). A negative result must be combined with clinical observations, patient history, and epidemiological information. The expected result is Negative. Fact Sheet for Patients:  PinkCheek.be Fact Sheet for Healthcare Providers:  GravelBags.it This test is not yet ap proved or cleared by the [place] FDA and  has been authorized for detection and/or diagnosis of SARS-CoV-2 by FDA under an Emergency Use Authorization (EUA). This EUA will remain  in effect (meaning this test can be used) for the duration of the COVID-19 declaration under Section 564(b)(1) of the Act, 21 U.S.C. section 360bbb-3(b)(1), unless the authorization is terminated or revoked sooner.    Influenza A by PCR NEGATIVE NEGATIVE Final   Influenza B by PCR NEGATIVE NEGATIVE Final    Comment: (NOTE) The Xpert Xpress SARS-CoV-2/FLU/RSV assay is intended as an aid in  the diagnosis of influenza from Nasopharyngeal swab specimens and  should  not be used as a sole basis for treatment. Nasal washings and  aspirates are unacceptable for Xpert Xpress SARS-CoV-2/FLU/RSV  testing. Fact Sheet for Patients: PinkCheek.be Fact Sheet for Healthcare Providers: GravelBags.it This test is not yet approved or cleared by the [place] FDA and  has been authorized for detection and/or diagnosis of SARS-CoV-2 by  FDA under an Emergency Use Authorization (EUA). This EUA will remain  in effect (meaning this test can be used) for the duration of the  Covid-19 declaration under Section 564(b)(1) of the Act, 21  U.S.C. section 360bbb-3(b)(1), unless the authorization is  terminated or revoked. Performed at [facility]  Lab, [address] [place], [place] [address]   Surgical pcr screen     Status: None   Collection Time: 01/17/20  5:24 AM   Specimen: Nasal Mucosa; Nasal Swab  Result Value Ref Range Status   MRSA, PCR NEGATIVE NEGATIVE Final   Staphylococcus aureus NEGATIVE NEGATIVE Final    Comment: (NOTE) The Xpert SA Assay (FDA approved for NASAL specimens in patients 42 years of age and older), is one component of a comprehensive surveillance program. It is not intended to diagnose infection nor to guide or monitor treatment. Performed at [facility], [place] [address] [place], [place] [address]   SARS CORONAVIRUS 2 (TAT 6-24 HRS) Nasopharyngeal Nasopharyngeal Swab     Status: None   Collection Time: 01/19/20  1:24 PM   Specimen: Nasopharyngeal Swab  Result Value Ref Range Status   SARS Coronavirus 2 NEGATIVE NEGATIVE Final    Comment: (NOTE) SARS-CoV-2 target nucleic acids are NOT DETECTED. The SARS-CoV-2 RNA is generally detectable in upper and lower respiratory specimens during the acute phase of infection. Negative results do not preclude SARS-CoV-2 infection, do not rule out co-infections with other pathogens, and should not be used as the sole basis for  treatment or other patient management decisions. Negative results must be combined with clinical observations, patient history, and epidemiological information. The expected result is Negative. Fact Sheet for Patients: SugarRoll.be Fact Sheet for Healthcare Providers: https://www.woods-mathews.com/ This test is not yet approved or cleared by the [place] FDA and  has been authorized for detection and/or diagnosis of SARS-CoV-2 by FDA under an Emergency Use Authorization (EUA). This EUA will remain  in effect (meaning this test can be used) for the duration of the COVID-19 declaration under Section 56 4(b)(1) of the Act, 21 U.S.C. section 360bbb-3(b)(1), unless the authorization is terminated or revoked sooner. Performed at [facility], [place] [address], [place], [place] [address]      Studies: No results found.  Scheduled Meds: . atorvastatin  40 mg Oral QHS  . bupivacaine liposome  20 mL Infiltration Once  . enoxaparin (LOVENOX) injection  40 mg Subcutaneous Q24H  . escitalopram  10 mg Oral Daily  . lisinopril  10 mg Oral Daily  . senna-docusate  1 tablet Oral BID    Continuous Infusions: . lactated ringers    . methocarbamol (ROBAXIN) IV      Time spent :25 minutes  I have personally reviewed and interpreted on  01/20/2020 daily labs,  imagings as discussed above under date review session and assessment and plans.  I reviewed all nursing notes, pharmacy notes, consultant notes,  vitals, pertinent old records  I have discussed plan of care as described above with RN , patient  on 01/20/2020   Florencia Reasons MD, PhD, FACP  [facility]  Available via Epic secure chat 7am-7pm for nonurgent issues Please page for urgent issues, pager number available through [email or web address] .   01/20/2020, 2:02 PM  LOS: 4 days

## 2020-01-21 DIAGNOSIS — M2550 Pain in unspecified joint: Secondary | ICD-10-CM | POA: Diagnosis not present

## 2020-01-21 DIAGNOSIS — Z743 Need for continuous supervision: Secondary | ICD-10-CM | POA: Diagnosis not present

## 2020-01-21 DIAGNOSIS — E559 Vitamin D deficiency, unspecified: Secondary | ICD-10-CM | POA: Diagnosis not present

## 2020-01-21 DIAGNOSIS — R001 Bradycardia, unspecified: Secondary | ICD-10-CM | POA: Diagnosis not present

## 2020-01-21 DIAGNOSIS — S72002A Fracture of unspecified part of neck of left femur, initial encounter for closed fracture: Secondary | ICD-10-CM | POA: Diagnosis not present

## 2020-01-21 DIAGNOSIS — D72829 Elevated white blood cell count, unspecified: Secondary | ICD-10-CM | POA: Diagnosis not present

## 2020-01-21 DIAGNOSIS — Z4789 Encounter for other orthopedic aftercare: Secondary | ICD-10-CM | POA: Diagnosis not present

## 2020-01-21 DIAGNOSIS — Z96642 Presence of left artificial hip joint: Secondary | ICD-10-CM | POA: Diagnosis not present

## 2020-01-21 DIAGNOSIS — H9193 Unspecified hearing loss, bilateral: Secondary | ICD-10-CM

## 2020-01-21 DIAGNOSIS — S72042D Displaced fracture of base of neck of left femur, subsequent encounter for closed fracture with routine healing: Secondary | ICD-10-CM | POA: Diagnosis not present

## 2020-01-21 DIAGNOSIS — H9190 Unspecified hearing loss, unspecified ear: Secondary | ICD-10-CM | POA: Diagnosis not present

## 2020-01-21 DIAGNOSIS — R6889 Other general symptoms and signs: Secondary | ICD-10-CM | POA: Diagnosis not present

## 2020-01-21 DIAGNOSIS — Z7901 Long term (current) use of anticoagulants: Secondary | ICD-10-CM | POA: Diagnosis not present

## 2020-01-21 DIAGNOSIS — E782 Mixed hyperlipidemia: Secondary | ICD-10-CM | POA: Diagnosis not present

## 2020-01-21 DIAGNOSIS — E039 Hypothyroidism, unspecified: Secondary | ICD-10-CM | POA: Diagnosis not present

## 2020-01-21 DIAGNOSIS — D649 Anemia, unspecified: Secondary | ICD-10-CM | POA: Diagnosis not present

## 2020-01-21 DIAGNOSIS — W19XXXD Unspecified fall, subsequent encounter: Secondary | ICD-10-CM | POA: Diagnosis not present

## 2020-01-21 DIAGNOSIS — Z720 Tobacco use: Secondary | ICD-10-CM | POA: Diagnosis not present

## 2020-01-21 DIAGNOSIS — Z7401 Bed confinement status: Secondary | ICD-10-CM | POA: Diagnosis not present

## 2020-01-21 DIAGNOSIS — C189 Malignant neoplasm of colon, unspecified: Secondary | ICD-10-CM | POA: Diagnosis not present

## 2020-01-21 DIAGNOSIS — M25552 Pain in left hip: Secondary | ICD-10-CM | POA: Diagnosis not present

## 2020-01-21 DIAGNOSIS — I10 Essential (primary) hypertension: Secondary | ICD-10-CM | POA: Diagnosis not present

## 2020-01-21 DIAGNOSIS — E785 Hyperlipidemia, unspecified: Secondary | ICD-10-CM | POA: Diagnosis not present

## 2020-01-21 LAB — BASIC METABOLIC PANEL
Anion gap: 8 (ref 5–15)
BUN: 24 mg/dL — ABNORMAL HIGH (ref 8–23)
CO2: 30 mmol/L (ref 22–32)
Calcium: 7.8 mg/dL — ABNORMAL LOW (ref 8.9–10.3)
Chloride: 101 mmol/L (ref 98–111)
Creatinine, Ser: 1.04 mg/dL (ref 0.61–1.24)
GFR calc Af Amer: >60 mL/min (ref >60)
GFR calc non Af Amer: >60 mL/min (ref >60)
Glucose, Bld: 109 mg/dL — ABNORMAL HIGH (ref 70–99)
Potassium: 3.7 mmol/L (ref 3.5–5.1)
Sodium: 139 mmol/L (ref 135–145)

## 2020-01-21 LAB — CBC
HCT: 39.2 % (ref 39.0–52.0)
Hemoglobin: 12.7 g/dL — ABNORMAL LOW (ref 13.0–17.0)
MCH: 31.8 pg (ref 26.0–34.0)
MCHC: 32.4 g/dL (ref 30.0–36.0)
MCV: 98 fL (ref 80.0–100.0)
Platelets: 208 10*3/uL (ref 150–400)
RBC: 4 MIL/uL — ABNORMAL LOW (ref 4.22–5.81)
RDW: 12.6 % (ref 11.5–15.5)
WBC: 12.1 10*3/uL — ABNORMAL HIGH (ref 4.0–10.5)
nRBC: 0 % (ref 0.0–0.2)

## 2020-01-21 MED ORDER — SENNOSIDES-DOCUSATE SODIUM 8.6-50 MG PO TABS: 1.0000 | ORAL_TABLET | Freq: Every day | ORAL | 0 refills | Status: AC

## 2020-01-21 NOTE — TOC Transition Note (Signed)
Transition of Care [facility]) - CM/SW Discharge Note   Patient Details  Name: Jonathan Sparks MRN: [id number] Date of Birth: [birth year]  [facility]) CM/SW Contact:  Jonathan Castilla, LCSW Phone Number: [phone] 01/21/2020, 11:35 AM   Clinical Narrative:     Patient will DC to:?[facility] Anticipated DC date:?01/21/20 Family notified:?Jonathan Sparks [id number]   Per MD patient ready for DC to [facility] . RN, patient, patient's family, and facility notified of DC. Discharge Summary sent to facility. RN given number for report  [id number] room [id number]. DC packet on chart. Ambulance [facility] requested for patient.   CSW signing off.   Jonathan Sparks, [place] [phone]   Final next level of care: Skilled Nursing Facility Barriers to Discharge: No Barriers Identified   Patient Goals and CMS Choice Patient states their goals for this hospitalization and ongoing recovery are:: get better to return home CMS Medicare.gov Compare Post Acute Care list provided to:: Patient Represenative (must comment)(Jonathan Sparks) Choice offered to / list presented to : Adult Children  Discharge Placement              Patient chooses bed at: [place] [place] Patient to be transferred to facility by: [facility] Name of family member notified: Jonathan Sparks Patient and family notified of of transfer: 01/21/20  Discharge Plan and Services In-house Referral: Clinical Social Work   Post Acute Care Choice: [place]                               Social Determinants of Health (SDOH) Interventions     Readmission Risk Interventions No flowsheet data found.

## 2020-01-21 NOTE — Plan of Care (Signed)

## 2020-01-21 NOTE — Progress Notes (Signed)
Bladder scans performed  Midnight - 0 5 am - 0

## 2020-01-21 NOTE — TOC Progression Note (Signed)
Transition of Care [facility]) - Progression Note    Patient Details  Name: [name] MRN: [id number] Date of Birth: Feb 15, 1941  Transition of Care [facility]) CM/SW [place], [place] Phone Number: [phone] 01/21/2020, 9:42 AM  Clinical Narrative:     CSW called [facility] to check on status of authorization. Authorization has been approved: [employer] #[phone] Health Plan #- [id number] Case Manager: Romie Minus Authorization dates: 3/6-3/9  CSW alerted MD on status of authorization.  TOC team will continue to follow for discharge planning needs.   Expected Discharge Plan: [place] Barriers to Discharge: No SNF bed, Continued Medical Work up  Expected Discharge Plan and Services Expected Discharge Plan: [place] In-house Referral: Clinical Social Work   Post Acute Care Choice: [place] Living arrangements for the past 2 months: Single Family Home                                       Social Determinants of Health (SDOH) Interventions    Readmission Risk Interventions No flowsheet data found.

## 2020-01-21 NOTE — Final Progress Note (Signed)
PIV removed. AVS placed in packet. Paper scripts also in packet and given to PTAR. Pt discharging to [facility]. Report called to Tammy, all questions and concerns answered. At time of discharge patient and son acknowledged having all belongings including hearing aids, hearing aid charger, shoes, socks, 3pk white shirts, and jeans. Pt to be transported to facility by [facility] on 2L. Son given copy of AVS.

## 2020-01-21 NOTE — Discharge Summary (Signed)
Discharge Summary  Jonathan Sparks [id number] DOB: October 11, 1941  PCP: Baruch Gouty, FNP  Admit date: 01/16/2020 Discharge date: 01/21/2020  Time spent: 79mins, more than 50% time spent on coordination of care.  Recommendations for Outpatient Follow-up:  1. F/u with SNF MD  for hospital discharge follow up, repeat cbc/bmp at follow up 2. F/u with orthopedic Dr Percell Miller in two weeks  Discharge Diagnoses:  Active Hospital Problems   Diagnosis Date Noted  . Closed left hip fracture, initial encounter ([place]) 01/16/2020  . Essential hypertension with goal blood pressure less than 130/80 08/05/2016  . Mixed hyperlipidemia 06/14/2014    Resolved Hospital Problems  No resolved problems to display.    Discharge Condition: stable  Diet recommendation: heart healthy  Filed Weights   01/16/20 2200 01/18/20 1238  Weight: 93 kg 93 kg    History of present illness: (per admitting MD Dr Hal Hope) PCP: Baruch Gouty, FNP  Patient coming from: Home.  Chief Complaint: Fall.  HPI: Jonathan Sparks is a 79 y.o. male with history of hypertension, hyperlipidemia, hard of hearing, prostate cancer and a fall when he was trying to reach to get a pack of cigarettes in his car.  Denies hitting his head or loss of consciousness.  Denies chest pain or shortness of breath.  Over the fall patient had left hip pain and was brought to the ER.  ED Course: X-rays revealed left hip fracture.  On-call orthopedic surgeon Dr. Griffin Basil has been consulted.  Labs are largely unremarkable with exception of mild leukocytosis Covid test is negative EKG shows normal sinus rhythm.  Patient admitted for further management.  Hospital Course:  Principal Problem:   Closed left hip fracture, initial encounter ([place]) Active Problems:   Essential hypertension with goal blood pressure less than 130/80   Mixed hyperlipidemia  Left hip fracture status post mechanical fall  -TOTAL HIP ARTHROPLASTY ANTERIOR APPROACH  (Left) by Dr. Ernesta Amble. Percell Miller on3/02/2020 -plan per Ortho : "Weightbearing: WBAT LLE Insicional and dressing care: Dressings left intact until follow-up Showering: Keep dressing dry VTE prophylaxis: Lovenox 40mg  qd 30 days, SCDs, ambulation Pain control: Minimize narcotics.  Continue current regimen. Follow - up plan: 2 weeks Contact information:  Edmonia Lynch MD"  Leukocytosis, likely reactive, he does not appear septic, chest x-ray unremarkable, UA no bacteria, no fever Wbc trending down from 19.6 to 12.1, snf MD to repeat cbc at follow up.   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  DVT Prophylaxis:  lovenox for post op DVT prophylaxis per ortho  Code Status: Full  Family Communication: son Jonathan Sparks who is HPOA over the phone on 3/3  Disposition Plan:    Patient came from:home  Anticipated d/c place:SNF post op, family agrees   Consultants:  ortho  Procedures:  TOTAL HIP ARTHROPLASTY ANTERIOR APPROACH (Left) by Dr. Ernesta Amble. Murphy on3/02/2020  Antibiotics:  Perioperative Ancef on March 4   Discharge Exam: BP (!) 150/67 (BP Location: Left Arm) Comment: nurse notified  Pulse 70   Temp 98.8 F (37.1 C) (Oral)   Resp 18   Ht 6' 0.01" (1.829 m)   Wt 93 kg   SpO2 96%   BMI 27.80 kg/m     General:  NAD, pleasant ,  very hard of hearing  Cardiovascular: RRR  Respiratory: CTABL  Abdomen: Soft/ND/NT, positive BS  Musculoskeletal: Left hip postop changes, neurovascular is intact distally  Neuro: alert, oriented    Discharge  Instructions You were cared for by a hospitalist during your hospital stay. If you have any questions about your discharge medications or the care you received while you were in the hospital after you are discharged, you can call the unit and asked to speak with the hospitalist on call if the hospitalist that took care of you is not available. Once you are discharged, your primary care physician will handle any further medical issues. Please note that NO REFILLS for any discharge medications will be authorized once you are discharged, as it is imperative that you return to your primary care physician (or establish a relationship with a primary care physician if you do not have one) for your aftercare needs so that they can reassess your need for medications and monitor your lab values.  Discharge Instructions    Diet - low sodium heart healthy   Complete by: As directed    Increase activity slowly   Complete by: As directed      Allergies as of 01/21/2020   No Known Allergies     Medication List    TAKE these medications   atorvastatin 40 MG tablet Commonly known as: LIPITOR Take 1 tablet (40 mg total) by mouth at bedtime.   enoxaparin 40 MG/0.4ML injection Commonly known as: LOVENOX Inject 0.4 mLs (40 mg total) into the skin daily for 30 doses. For 30 days post op for DVT prophylaxis   escitalopram 10 MG tablet Commonly known as: Lexapro Take 1 tablet (10 mg total) by mouth daily.   HYDROcodone-acetaminophen 5-325 MG tablet Commonly known as: Norco Take 1 tablet by mouth every 6 (six) hours as needed for up to 7 days for severe pain (Use Tylenol for mild and moderate pain).   lisinopril 10 MG tablet Commonly known as: ZESTRIL Take 1 tablet (10 mg total) by mouth daily.   senna-docusate 8.6-50 MG tablet Commonly known as: Senokot-S Take 1 tablet by mouth at bedtime.   Vitamin D (Ergocalciferol) 1.25 MG (50000 UNIT) Caps capsule Commonly known as: DRISDOL Take 1 capsule  (50,000 Units total) by mouth every 7 (seven) days.      No Known Allergies  Contact information for follow-up providers    Renette Butters, MD In 2 weeks.   Specialty: Orthopedic Surgery Contact information: [address] [place] [address] [phone]        Rakes, Linda M, FNP Follow up.   Specialty: Family Medicine Contact information: [place]  [phone]            Contact information for after-discharge care    Destination    [facility] Preferred SNF .   Service: Skilled Nursing Contact information: [place] [phone]                   The results of significant diagnostics from this hospitalization (including imaging, microbiology, ancillary and laboratory) are listed below for reference.    Significant Diagnostic Studies: DG Chest 1 View  Result Date: 01/16/2020 CLINICAL DATA:  Fall with hip pain EXAM: CHEST  1 VIEW COMPARISON:  03/29/2007 FINDINGS: Right lung is clear. Slightly elevated left diaphragm with atelectasis at the left base. Mild cardiomegaly with aortic atherosclerosis. Negative for pleural effusion  or pneumothorax. IMPRESSION: 1. Cardiomegaly without edema or infiltrate 2. Linear atelectasis or scarring at the left base Electronically Signed   By: Donavan Foil M.D.   On: 01/16/2020 22:41   DG C-Arm 1-60 Min  Result Date: 01/18/2020 CLINICAL DATA:  Total left hip arthroplasty EXAM: DG C-ARM 1-60 MIN; OPERATIVE LEFT HIP WITH PELVIS COMPARISON:  01/16/2020 FINDINGS: Changes of left hip replacement. Normal AP alignment. No hardware bony complicating feature. Radiation seeds in the region of the prostate. IMPRESSION: Left hip replacement.  No visible complicating feature. Electronically Signed   By: Rolm Baptise M.D.   On: 01/18/2020 16:54   DG HIP OPERATIVE UNILAT W OR W/O PELVIS LEFT  Result Date: 01/18/2020 CLINICAL  DATA:  Total left hip arthroplasty EXAM: DG C-ARM 1-60 MIN; OPERATIVE LEFT HIP WITH PELVIS COMPARISON:  01/16/2020 FINDINGS: Changes of left hip replacement. Normal AP alignment. No hardware bony complicating feature. Radiation seeds in the region of the prostate. IMPRESSION: Left hip replacement.  No visible complicating feature. Electronically Signed   By: Rolm Baptise M.D.   On: 01/18/2020 16:54   DG Hip Unilat W or Wo Pelvis 2-3 Views Left  Result Date: 01/16/2020 CLINICAL DATA:  79 year old male with fall and left hip pain. EXAM: DG HIP (WITH OR WITHOUT PELVIS) 2-3V LEFT COMPARISON:  None. FINDINGS: There is a mildly displaced fracture of the left femoral neck. No other acute fracture identified. 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 No dislocation. Degenerative changes of the lower lumbar spine. The soft tissues are unremarkable. Prostate brachytherapy seeds noted. IMPRESSION: Mildly displaced left femoral neck fracture. Electronically Signed   By: Anner Crete M.D.   On: 01/16/2020 22:41    Microbiology: Recent Results (from the past 240 hour(s))  Respiratory Panel by RT PCR (Flu A&B, Covid) - Nasopharyngeal Swab     Status: None   Collection Time: 01/16/20 10:16 PM   Specimen: Nasopharyngeal Swab  Result Value Ref Range Status   SARS Coronavirus 2 by RT PCR NEGATIVE NEGATIVE Final    Comment: (NOTE) SARS-CoV-2 target nucleic acids are NOT DETECTED. The SARS-CoV-2 RNA is generally detectable in upper respiratoy specimens during the acute phase of infection. The lowest concentration of SARS-CoV-2 viral copies this assay can detect is 131 copies/mL. A negative result does not preclude SARS-Cov-2 infection and should not be used as the sole basis for treatment or other patient management decisions. A negative result may occur with  improper specimen collection/handling, submission of specimen other than nasopharyngeal swab, presence of viral mutation(s) within the areas targeted by this assay, and  inadequate number of viral copies (<131 copies/mL). A negative result must be combined with clinical observations, patient history, and epidemiological information. The expected result is Negative. Fact Sheet for Patients:  PinkCheek.be Fact Sheet for Healthcare Providers:  GravelBags.it This test is not yet ap proved or cleared by the [place] FDA and  has been authorized for detection and/or diagnosis of SARS-CoV-2 by FDA under an Emergency Use Authorization (EUA). This EUA will remain  in effect (meaning this test can be used) for the duration of the COVID-19 declaration under Section 564(b)(1) of the Act, 21 U.S.C. section 360bbb-3(b)(1), unless the authorization is terminated or revoked sooner.    Influenza A by PCR NEGATIVE NEGATIVE Final   Influenza B by PCR NEGATIVE NEGATIVE Final    Comment: (NOTE) The Xpert Xpress SARS-CoV-2/FLU/RSV assay is intended as an aid in  the diagnosis of influenza from Nasopharyngeal swab specimens and  should not be  used as a sole basis for treatment. Nasal washings and  aspirates are unacceptable for Xpert Xpress SARS-CoV-2/FLU/RSV  testing. Fact Sheet for Patients: PinkCheek.be Fact Sheet for Healthcare Providers: GravelBags.it This test is not yet approved or cleared by the [place] FDA and  has been authorized for detection and/or diagnosis of SARS-CoV-2 by  FDA under an Emergency Use Authorization (EUA). This EUA will remain  in effect (meaning this test can be used) for the duration of the  Covid-19 declaration under Section 564(b)(1) of the Act, 21  U.S.C. section 360bbb-3(b)(1), unless the authorization is  terminated or revoked. Performed at [facility], [place] [address] [place], [place] [address]   Surgical pcr screen     Status: None   Collection Time: 01/17/20  5:24 AM   Specimen: Nasal Mucosa; Nasal  Swab  Result Value Ref Range Status   MRSA, PCR NEGATIVE NEGATIVE Final   Staphylococcus aureus NEGATIVE NEGATIVE Final    Comment: (NOTE) The Xpert SA Assay (FDA approved for NASAL specimens in patients 67 years of age and older), is one component of a comprehensive surveillance program. It is not intended to diagnose infection nor to guide or monitor treatment. Performed at [facility], [place] [address], [place], [place] [address]   SARS CORONAVIRUS 2 (TAT 6-24 HRS) Nasopharyngeal Nasopharyngeal Swab     Status: None   Collection Time: 01/19/20  1:24 PM   Specimen: Nasopharyngeal Swab  Result Value Ref Range Status   SARS Coronavirus 2 NEGATIVE NEGATIVE Final    Comment: (NOTE) SARS-CoV-2 target nucleic acids are NOT DETECTED. The SARS-CoV-2 RNA is generally detectable in upper and lower respiratory specimens during the acute phase of infection. Negative results do not preclude SARS-CoV-2 infection, do not rule out co-infections with other pathogens, and should not be used as the sole basis for treatment or other patient management decisions. Negative results must be combined with clinical observations, patient history, and epidemiological information. The expected result is Negative. Fact Sheet for Patients: SugarRoll.be Fact Sheet for Healthcare Providers: https://www.woods-mathews.com/ This test is not yet approved or cleared by the [place] FDA and  has been authorized for detection and/or diagnosis of SARS-CoV-2 by FDA under an Emergency Use Authorization (EUA). This EUA will remain  in effect (meaning this test can be used) for the duration of the COVID-19 declaration under Section 56 4(b)(1) of the Act, 21 U.S.C. section 360bbb-3(b)(1), unless the authorization is terminated or revoked sooner. Performed at [facility], [place] [address] [place], [place] [address]      Labs: Basic Metabolic Panel: Recent Labs    Lab 01/17/20 [phone] 01/18/20 0412 01/19/20 1019 01/20/20 0440 01/21/20 0609  NA 137 139 136 137 139  K 4.2 4.4 4.2 4.4 3.7  CL 97* 100 99 100 101  CO2 31 30 28 28 30   GLUCOSE 122* 117* 202* 128* 109*  BUN 16 13 17  24* 24*  CREATININE 1.15 1.25* 1.23 1.10 1.04  CALCIUM 8.4* 8.4* 8.3* 8.1* 7.8*   Liver Function Tests: No results for input(s): AST, ALT, ALKPHOS, BILITOT, PROT, ALBUMIN in the last 168 hours. No results for input(s): LIPASE, AMYLASE in the last 168 hours. No results for input(s): AMMONIA in the last 168 hours. CBC: Recent Labs  Lab 01/16/20 2317 01/16/20 2317 01/17/20 0806 01/18/20 0412 01/19/20 1019 01/20/20 0440 01/21/20 0416  WBC 17.7*   < > 14.7* 12.4* 18.4* 19.6* 12.1*  NEUTROABS 15.6*  --  12.7* 10.0*  --   --   --  HGB 16.4   < > 16.0 15.9 13.8 13.0 12.7*  HCT 49.3   < > 48.1 48.6 41.4 39.0 39.2  MCV 95.7   < > 95.4 96.6 95.0 95.1 98.0  PLT 244   < > 220 194 182 195 208   < > = values in this interval not displayed.   Cardiac Enzymes: No results for input(s): CKTOTAL, CKMB, CKMBINDEX, TROPONINI in the last 168 hours. BNP: BNP (last 3 results) No results for input(s): BNP in the last 8760 hours.  ProBNP (last 3 results) No results for input(s): PROBNP in the last 8760 hours.  CBG: No results for input(s): GLUCAP in the last 168 hours.     Signed:  Florencia Reasons MD, PhD, FACP  [facility] 01/21/2020, 9:53 AM

## 2020-01-21 NOTE — Progress Notes (Signed)
   ORTHOPEDIC PROGRESS NOTE  S/P TOTAL HIP ARTHROPLASTY ANTERIOR APPROACH (Left) on 01/18/2020 by Dr. Percell Miller  Subjective: Patient reports minimal pain about the operative sight. Resting comfortably in bed.   Tolerating diet.  No CP, SOB.   No weakness/dizziness. No nausea, vomiting.   Objective:   VITALS:   Vitals:   01/20/20 1438 01/20/20 2007 01/20/20 2100 01/21/20 0500  BP: (!) 128/55 (!) 143/68 (!) 143/68 138/70  Pulse: 64 63 63 66  Resp: 18 16 18 18   Temp: (!) 97.3 F (36.3 C) 98.6 F (37 C) 98.6 F (37 C) 98.2 F (36.8 C)  TempSrc: Oral Oral Oral Oral  SpO2: 98% 97% 98% 98%  Weight:      Height:       CBC Latest Ref Rng & Units 01/21/2020 01/20/2020 01/19/2020  WBC 4.0 - 10.5 K/uL 12.1(H) 19.6(H) 18.4(H)  Hemoglobin 13.0 - 17.0 g/dL 12.7(L) 13.0 13.8  Hematocrit 39.0 - 52.0 % 39.2 39.0 41.4  Platelets 150 - 400 K/uL 208 195 182   BMP Latest Ref Rng & Units 01/20/2020 01/19/2020 01/18/2020  Glucose 70 - 99 mg/dL 128(H) 202(H) 117(H)  BUN 8 - 23 mg/dL 24(H) 17 13  Creatinine 0.61 - 1.24 mg/dL 1.10 1.23 1.25(H)  BUN/Creat Ratio 10 - 24 - - -  Sodium 135 - 145 mmol/L 137 136 139  Potassium 3.5 - 5.1 mmol/L 4.4 4.2 4.4  Chloride 98 - 111 mmol/L 100 99 100  CO2 22 - 32 mmol/L 28 28 30   Calcium 8.9 - 10.3 mg/dL 8.1(L) 8.3(L) 8.4(L)   Intake/Output      03/06 0701 - 03/07 0700 03/07 0701 - 03/08 0700   P.O. 720    I.V. (mL/kg) 605.5 (6.5)    IV Piggyback 0    Total Intake(mL/kg) 1325.5 (14.3)    Urine (mL/kg/hr) 1025 (0.5)    Stool     Total Output 1025    Net +300.5            Physical Exam: General: NAD.  Upright in bed.  Very hard of hearing.  No acute distress MSK LLE: dressings CDI, leg lengths equal, intact EHL/TA/GSC, warm well perfused foot   Assessment: 3 Days Post-Op  S/P Procedure(s) (LRB): TOTAL HIP ARTHROPLASTY ANTERIOR APPROACH (Left) by Dr. Ernesta Amble. Percell Miller on 01/18/2020  Principal Problem:   Closed left hip fracture, initial encounter [facility]) Active  Problems:   Essential hypertension with goal blood pressure less than 130/80   Mixed hyperlipidemia  Closed left femoral neck fracture, status post left total hip arthroplasty Doing well postop day 3 Tolerating diet and voiding - bladder scan performed at midnight and 5 am - 0 mL Pain controlled  Leukocytosis, likely reactive. Per hospitalist team, "does not appear septic, chest x-ray unremarkable, UA no bacteria, no fever, Wbc fluctuating, continue IV hydration, repeat cbc in am" WBC trending down.   Plan: Continue working with PT Incentive Spirometry Apply ice PRN  Weightbearing: WBAT LLE Insicional and dressing care: Dressings left intact until follow-up Showering: Keep dressing dry VTE prophylaxis: Lovenox 40mg  qd 30 days, SCDs, ambulation Pain control: Minimize narcotics.  Continue current regimen. Follow - up plan: 2 weeks Contact information:  Edmonia Lynch MD, Roxan Hockey PA-C  Dispo: PT recommending SNF. TOC following.   Cleared for discharge from orthopedics standpoint once cleared by medicine team and therapies.  Prescriptions for discharge medications were printed and placed in the patient's chart.    Ethelda Chick, PA-C 01/21/2020, 7:18 AM

## 2020-01-22 NOTE — Op Note (Signed)
01/18/2020  8:16 AM  PATIENT:  Jonathan Sparks   MRN: [id number]  PRE-OPERATIVE DIAGNOSIS:  LEFT HIP FRACTURE  POST-OPERATIVE DIAGNOSIS:  LEFT HIP FRACTURE  PROCEDURE:  Procedure(s): TOTAL HIP ARTHROPLASTY ANTERIOR APPROACH  PREOPERATIVE INDICATIONS:    Jonathan Sparks is an 79 y.o. male who has a diagnosis of Closed left hip fracture, initial encounter ([place]) and elected for surgical management after failing conservative treatment.  The risks benefits and alternatives were discussed with the patient including but not limited to the risks of nonoperative treatment, versus surgical intervention including infection, bleeding, nerve injury, periprosthetic fracture, the need for revision surgery, dislocation, leg length discrepancy, blood clots, cardiopulmonary complications, morbidity, mortality, among others, and they were willing to proceed.     OPERATIVE REPORT     SURGEON:   Renette Butters, MD    ASSISTANT:  Roxan Hockey, PA-C, he was present and scrubbed throughout the case, critical for completion in a timely fashion, and for retraction, instrumentation, and closure.     ANESTHESIA:  General    COMPLICATIONS:  None.     COMPONENTS:  Stryker acolade fit femur size 7 with a 36 mm -2.5 head ball and an acetabular shell size 52 with a  polyethylene liner    PROCEDURE IN DETAIL:   The patient was met in the holding area and  identified.  The appropriate hip was identified and marked at the operative site.  The patient was then transported to the OR  and  placed under anesthesia per that record.  At that point, the patient was  placed in the supine position and  secured to the operating room table and all bony prominences padded. He received pre-operative antibiotics    The operative lower extremity was prepped from the iliac crest to the distal leg.  Sterile draping was performed.  Time out was performed prior to incision.      Skin incision was made just 2 cm lateral to  the ASIS  extending in line with the tensor fascia lata. Electrocautery was used to control all bleeders. I dissected down sharply to the fascia of the tensor fascia lata was confirmed that the muscle fibers beneath were running posteriorly. I then incised the fascia over the superficial tensor fascia lata in line with the incision. The fascia was elevated off the anterior aspect of the muscle the muscle was retracted posteriorly and protected throughout the case. I then used electrocautery to incise the tensor fascia lata fascia control and all bleeders. Immediately visible was the fat over top of the anterior neck and capsule.  I removed the anterior fat from the capsule and elevated the rectus muscle off of the anterior capsule. I then removed a large time of capsule. The retractors were then placed over the anterior acetabulum as well as around the superior and inferior neck.  I then made a femoral neck cut. Then used the power corkscrew to remove the femoral head from the acetabulum and thoroughly irrigated the acetabulum. I sized the femoral head.    I then exposed the deep acetabulum, cleared out any tissue including the ligamentum teres.   After adequate visualization, I excised the labrum, and then sequentially reamed.  I then impacted the acetabular implant into place using fluoroscopy for guidance.  Appropriate version and inclination was confirmed clinically matching their bony anatomy, and with fluoroscopy.  I placed a 20 mm screw in the posterior/superio position with an excellent bite.    I then placed the  polyethylene liner in place  I then adducted the leg and released the external rotators from the posterior femur allowing it to be easily delivered up lateral and anterior to the acetabulum for preparation of the femoral canal.    I then prepared the proximal femur using the cookie-cutter and then sequentially reamed and broached.  A trial broach, neck, and head was utilized, and I  reduced the hip and used floroscopy to assess the neck length and femoral implant.  I then impacted the femoral prosthesis into place into the appropriate version. The hip was then reduced and fluoroscopy confirmed appropriate position. Leg lengths were restored.  I then irrigated the hip copiously again with, and repaired the fascia with Vicryl, followed by monocryl for the subcutaneous tissue, Monocryl for the skin, Steri-Strips and sterile gauze. The patient was then awakened and returned to PACU in stable and satisfactory condition. There were no complications.  POST OPERATIVE PLAN: WBAT, DVT px: SCD's/TED, ambulation and chemical dvt px  Edmonia Lynch, MD Orthopedic Surgeon [phone]

## 2020-01-23 ENCOUNTER — Encounter: Payer: Self-pay | Admitting: *Deleted

## 2020-02-02 DIAGNOSIS — M25552 Pain in left hip: Secondary | ICD-10-CM | POA: Diagnosis not present

## 2020-02-05 ENCOUNTER — Telehealth: Payer: Self-pay | Admitting: Family Medicine

## 2020-02-05 NOTE — Telephone Encounter (Signed)
MAking appt with Monia Pouch

## 2020-02-06 DIAGNOSIS — I10 Essential (primary) hypertension: Secondary | ICD-10-CM | POA: Diagnosis not present

## 2020-02-06 DIAGNOSIS — S72002A Fracture of unspecified part of neck of left femur, initial encounter for closed fracture: Secondary | ICD-10-CM | POA: Diagnosis not present

## 2020-02-06 DIAGNOSIS — E785 Hyperlipidemia, unspecified: Secondary | ICD-10-CM | POA: Diagnosis not present

## 2020-02-06 DIAGNOSIS — H9190 Unspecified hearing loss, unspecified ear: Secondary | ICD-10-CM | POA: Diagnosis not present

## 2020-02-08 ENCOUNTER — Encounter: Payer: Self-pay | Admitting: *Deleted

## 2020-02-12 ENCOUNTER — Ambulatory Visit: Payer: Medicare Other | Admitting: Family Medicine

## 2020-02-12 ENCOUNTER — Encounter: Payer: Self-pay | Admitting: Family Medicine

## 2020-02-12 DIAGNOSIS — S72042D Displaced fracture of base of neck of left femur, subsequent encounter for closed fracture with routine healing: Secondary | ICD-10-CM | POA: Diagnosis not present

## 2020-02-12 DIAGNOSIS — I700 Atherosclerosis of aorta: Secondary | ICD-10-CM | POA: Diagnosis not present

## 2020-02-12 DIAGNOSIS — J9811 Atelectasis: Secondary | ICD-10-CM | POA: Diagnosis not present

## 2020-02-12 DIAGNOSIS — I119 Hypertensive heart disease without heart failure: Secondary | ICD-10-CM | POA: Diagnosis not present

## 2020-02-15 DIAGNOSIS — I119 Hypertensive heart disease without heart failure: Secondary | ICD-10-CM | POA: Diagnosis not present

## 2020-02-15 DIAGNOSIS — J9811 Atelectasis: Secondary | ICD-10-CM | POA: Diagnosis not present

## 2020-02-15 DIAGNOSIS — I700 Atherosclerosis of aorta: Secondary | ICD-10-CM | POA: Diagnosis not present

## 2020-02-15 DIAGNOSIS — S72042D Displaced fracture of base of neck of left femur, subsequent encounter for closed fracture with routine healing: Secondary | ICD-10-CM | POA: Diagnosis not present

## 2020-02-19 DIAGNOSIS — I119 Hypertensive heart disease without heart failure: Secondary | ICD-10-CM | POA: Diagnosis not present

## 2020-02-19 DIAGNOSIS — S72042D Displaced fracture of base of neck of left femur, subsequent encounter for closed fracture with routine healing: Secondary | ICD-10-CM | POA: Diagnosis not present

## 2020-02-19 DIAGNOSIS — J9811 Atelectasis: Secondary | ICD-10-CM | POA: Diagnosis not present

## 2020-02-19 DIAGNOSIS — I700 Atherosclerosis of aorta: Secondary | ICD-10-CM | POA: Diagnosis not present

## 2020-02-23 DIAGNOSIS — J9811 Atelectasis: Secondary | ICD-10-CM | POA: Diagnosis not present

## 2020-02-23 DIAGNOSIS — I700 Atherosclerosis of aorta: Secondary | ICD-10-CM | POA: Diagnosis not present

## 2020-02-23 DIAGNOSIS — I119 Hypertensive heart disease without heart failure: Secondary | ICD-10-CM | POA: Diagnosis not present

## 2020-02-23 DIAGNOSIS — S72042D Displaced fracture of base of neck of left femur, subsequent encounter for closed fracture with routine healing: Secondary | ICD-10-CM | POA: Diagnosis not present

## 2020-02-26 ENCOUNTER — Other Ambulatory Visit: Payer: Self-pay

## 2020-02-26 ENCOUNTER — Ambulatory Visit (INDEPENDENT_AMBULATORY_CARE_PROVIDER_SITE_OTHER): Payer: Medicare Other

## 2020-02-26 DIAGNOSIS — I700 Atherosclerosis of aorta: Secondary | ICD-10-CM

## 2020-02-26 DIAGNOSIS — F329 Major depressive disorder, single episode, unspecified: Secondary | ICD-10-CM | POA: Diagnosis not present

## 2020-02-26 DIAGNOSIS — Z8546 Personal history of malignant neoplasm of prostate: Secondary | ICD-10-CM

## 2020-02-26 DIAGNOSIS — Z7901 Long term (current) use of anticoagulants: Secondary | ICD-10-CM

## 2020-02-26 DIAGNOSIS — I119 Hypertensive heart disease without heart failure: Secondary | ICD-10-CM | POA: Diagnosis not present

## 2020-02-26 DIAGNOSIS — Z96642 Presence of left artificial hip joint: Secondary | ICD-10-CM

## 2020-02-26 DIAGNOSIS — J9811 Atelectasis: Secondary | ICD-10-CM

## 2020-02-26 DIAGNOSIS — Z9181 History of falling: Secondary | ICD-10-CM

## 2020-02-26 DIAGNOSIS — M85852 Other specified disorders of bone density and structure, left thigh: Secondary | ICD-10-CM

## 2020-02-26 DIAGNOSIS — E782 Mixed hyperlipidemia: Secondary | ICD-10-CM

## 2020-02-26 DIAGNOSIS — S72042D Displaced fracture of base of neck of left femur, subsequent encounter for closed fracture with routine healing: Secondary | ICD-10-CM

## 2020-02-26 DIAGNOSIS — D72829 Elevated white blood cell count, unspecified: Secondary | ICD-10-CM

## 2020-02-26 DIAGNOSIS — H9190 Unspecified hearing loss, unspecified ear: Secondary | ICD-10-CM

## 2020-02-26 DIAGNOSIS — M47816 Spondylosis without myelopathy or radiculopathy, lumbar region: Secondary | ICD-10-CM

## 2020-02-27 DIAGNOSIS — J9811 Atelectasis: Secondary | ICD-10-CM | POA: Diagnosis not present

## 2020-02-27 DIAGNOSIS — I119 Hypertensive heart disease without heart failure: Secondary | ICD-10-CM | POA: Diagnosis not present

## 2020-02-27 DIAGNOSIS — I700 Atherosclerosis of aorta: Secondary | ICD-10-CM | POA: Diagnosis not present

## 2020-02-27 DIAGNOSIS — S72042D Displaced fracture of base of neck of left femur, subsequent encounter for closed fracture with routine healing: Secondary | ICD-10-CM | POA: Diagnosis not present

## 2020-03-01 DIAGNOSIS — J9811 Atelectasis: Secondary | ICD-10-CM | POA: Diagnosis not present

## 2020-03-01 DIAGNOSIS — I700 Atherosclerosis of aorta: Secondary | ICD-10-CM | POA: Diagnosis not present

## 2020-03-01 DIAGNOSIS — I119 Hypertensive heart disease without heart failure: Secondary | ICD-10-CM | POA: Diagnosis not present

## 2020-03-01 DIAGNOSIS — S72042D Displaced fracture of base of neck of left femur, subsequent encounter for closed fracture with routine healing: Secondary | ICD-10-CM | POA: Diagnosis not present

## 2020-03-05 DIAGNOSIS — I119 Hypertensive heart disease without heart failure: Secondary | ICD-10-CM | POA: Diagnosis not present

## 2020-03-05 DIAGNOSIS — S72042D Displaced fracture of base of neck of left femur, subsequent encounter for closed fracture with routine healing: Secondary | ICD-10-CM | POA: Diagnosis not present

## 2020-03-05 DIAGNOSIS — J9811 Atelectasis: Secondary | ICD-10-CM | POA: Diagnosis not present

## 2020-03-05 DIAGNOSIS — I700 Atherosclerosis of aorta: Secondary | ICD-10-CM | POA: Diagnosis not present

## 2020-03-13 DIAGNOSIS — J9811 Atelectasis: Secondary | ICD-10-CM | POA: Diagnosis not present

## 2020-03-13 DIAGNOSIS — I700 Atherosclerosis of aorta: Secondary | ICD-10-CM | POA: Diagnosis not present

## 2020-03-13 DIAGNOSIS — S72042D Displaced fracture of base of neck of left femur, subsequent encounter for closed fracture with routine healing: Secondary | ICD-10-CM | POA: Diagnosis not present

## 2020-03-13 DIAGNOSIS — I119 Hypertensive heart disease without heart failure: Secondary | ICD-10-CM | POA: Diagnosis not present

## 2020-03-18 ENCOUNTER — Ambulatory Visit: Payer: Medicare Other | Admitting: Family Medicine

## 2020-03-18 ENCOUNTER — Ambulatory Visit: Payer: Medicare Other | Admitting: Family

## 2020-03-18 DIAGNOSIS — M25552 Pain in left hip: Secondary | ICD-10-CM | POA: Diagnosis not present

## 2020-03-19 DIAGNOSIS — I700 Atherosclerosis of aorta: Secondary | ICD-10-CM | POA: Diagnosis not present

## 2020-03-19 DIAGNOSIS — I119 Hypertensive heart disease without heart failure: Secondary | ICD-10-CM | POA: Diagnosis not present

## 2020-03-19 DIAGNOSIS — J9811 Atelectasis: Secondary | ICD-10-CM | POA: Diagnosis not present

## 2020-03-19 DIAGNOSIS — S72042D Displaced fracture of base of neck of left femur, subsequent encounter for closed fracture with routine healing: Secondary | ICD-10-CM | POA: Diagnosis not present

## 2020-03-26 ENCOUNTER — Encounter: Payer: Self-pay | Admitting: Family Medicine

## 2020-03-28 DIAGNOSIS — I700 Atherosclerosis of aorta: Secondary | ICD-10-CM | POA: Diagnosis not present

## 2020-03-28 DIAGNOSIS — J9811 Atelectasis: Secondary | ICD-10-CM | POA: Diagnosis not present

## 2020-03-28 DIAGNOSIS — S72042D Displaced fracture of base of neck of left femur, subsequent encounter for closed fracture with routine healing: Secondary | ICD-10-CM | POA: Diagnosis not present

## 2020-03-28 DIAGNOSIS — I119 Hypertensive heart disease without heart failure: Secondary | ICD-10-CM | POA: Diagnosis not present

## 2020-04-03 DIAGNOSIS — I119 Hypertensive heart disease without heart failure: Secondary | ICD-10-CM | POA: Diagnosis not present

## 2020-04-03 DIAGNOSIS — J9811 Atelectasis: Secondary | ICD-10-CM | POA: Diagnosis not present

## 2020-04-03 DIAGNOSIS — S72042D Displaced fracture of base of neck of left femur, subsequent encounter for closed fracture with routine healing: Secondary | ICD-10-CM | POA: Diagnosis not present

## 2020-04-03 DIAGNOSIS — I700 Atherosclerosis of aorta: Secondary | ICD-10-CM | POA: Diagnosis not present

## 2020-04-18 ENCOUNTER — Ambulatory Visit: Payer: Medicare Other | Admitting: Family

## 2020-04-29 ENCOUNTER — Encounter: Payer: Self-pay | Admitting: Family

## 2020-04-29 ENCOUNTER — Other Ambulatory Visit: Payer: Self-pay

## 2020-04-29 ENCOUNTER — Ambulatory Visit (INDEPENDENT_AMBULATORY_CARE_PROVIDER_SITE_OTHER): Payer: Medicare Other | Admitting: Family

## 2020-04-29 VITALS — BP 145/84 | HR 75 | Temp 98.4°F | Ht 72.0 in | Wt 206.2 lb

## 2020-04-29 DIAGNOSIS — Z1159 Encounter for screening for other viral diseases: Secondary | ICD-10-CM

## 2020-04-29 DIAGNOSIS — I10 Essential (primary) hypertension: Secondary | ICD-10-CM | POA: Diagnosis not present

## 2020-04-29 DIAGNOSIS — E559 Vitamin D deficiency, unspecified: Secondary | ICD-10-CM

## 2020-04-29 DIAGNOSIS — E782 Mixed hyperlipidemia: Secondary | ICD-10-CM

## 2020-04-29 DIAGNOSIS — B351 Tinea unguium: Secondary | ICD-10-CM

## 2020-04-29 DIAGNOSIS — F411 Generalized anxiety disorder: Secondary | ICD-10-CM

## 2020-04-29 DIAGNOSIS — F3342 Major depressive disorder, recurrent, in full remission: Secondary | ICD-10-CM

## 2020-04-29 DIAGNOSIS — I872 Venous insufficiency (chronic) (peripheral): Secondary | ICD-10-CM | POA: Diagnosis not present

## 2020-04-29 MED ORDER — ATORVASTATIN CALCIUM 40 MG PO TABS: 40.0000 mg | ORAL_TABLET | Freq: Every day | ORAL | 1 refills | Status: DC

## 2020-04-29 MED ORDER — VITAMIN D (ERGOCALCIFEROL) 1.25 MG (50000 UNIT) PO CAPS: 50000.0000 [IU] | ORAL_CAPSULE | ORAL | 6 refills | Status: DC

## 2020-04-29 MED ORDER — LISINOPRIL 10 MG PO TABS: 10.0000 mg | ORAL_TABLET | Freq: Every day | ORAL | 3 refills | Status: DC

## 2020-04-29 MED ORDER — ESCITALOPRAM OXALATE 10 MG PO TABS: 10.0000 mg | ORAL_TABLET | Freq: Every day | ORAL | 2 refills | Status: DC

## 2020-04-29 NOTE — Patient Instructions (Signed)
Hip Rehabilitation in the Home After hip surgery, it is important to follow instructions from your health care provider about rehabilitation (rehab). It is important to design a program that is safe and effective for you. Your health care provider and rehab therapist will work with you to meet your specific abilities and needs. What are the benefits? Hip rehab can help to:  Strengthen your hip.  Improve the flexibility and movement (range of motion) of your hip joint.  Reduce swelling.  Improve blood flow and prevent blood clots. How to do exercises at home   Continue exercises at home that your health care provider or rehab therapist instructed you to do in the hospital.  Before you exercise: ? Take pain medicines, if told by your health care provider. Do not take the medicine if it makes you feel dizzy or sleepy. ? Do a warm-up activity, such as gentle walking, as told by your health care provider. This warms up your muscles and helps prevent injury.  While doing exercises: ? When standing, make sure you are near something sturdy that you can hold onto for balance, such as a heavy chair or a wall. ? Do exercises exactly as told by your health care provider and adjust them as directed. ? As you are recovering, choose an exercise pace that is comfortable for you, and gradually work up to your goal. ? Follow activity restrictions as told by your health care provider. This may vary depending on the type of hip surgery you had. For example, your health care provider may instruct you to:  Not bring your knees higher than your hips.  Not cross your legs.  Not twist while lying down or standing.  Avoid rotating the toes of your affected leg inward. Keep your toes pointing straight ahead.  Do not exercise in a pool (aquatic therapy) until your incision from surgery is healed and your health care provider says that you can. Follow these instructions at home: Activity  Do not use your  affected leg to support your body weight until your health care provider says that you can. Use crutches or a walker as told by your health care provider.  Ask your health care provider what activities are safe for you during recovery, and ask what activities you need to avoid.  Avoid sitting for a long time without moving. Get up to take short walks every 1-2 hours. Ask for help if you feel weak or unsteady. Managing pain, stiffness, and swelling   Put ice on affected areas after you exercise, or as needed. Icing can help to relieve joint pain and swelling. ? Put ice in a plastic bag that you were given. ? Place a towel between your skin and the bag. ? Leave the ice on for 20 minutes, 2-3 times a day.  If directed, apply heat to affected areas before you exercise, or as needed. Heat can reduce muscle and joint stiffness. Use the heat source that your health care provider recommends, such as a moist heat pack or a heating pad. ? Place a towel between your skin and the heat source. ? Leave the heat on for 20-30 minutes. ? Remove the heat if your skin turns bright red. This is especially important if you are unable to feel pain, heat, or cold. You may have a greater risk of getting burned.  Wear compression stockings as told by your health care provider. These stockings help to prevent blood clots and reduce swelling in your legs.    Raise (elevate) your legs while sitting or lying down. Preventing falls  Keep your home well-lit and clutter-free, especially in walkways and stairways. Keep floors dry and use non-skid mats.  Remove tripping hazards from floors, such as throw rugs and cords.  Install grab bars in bathrooms, and put night-lights in your bedroom and bathroom.  Wear closed-toe shoes that fit well and support your feet. Wear shoes that have rubber soles or low heels.  Talk with your health care provider about the over-the-counter and prescription medicines that you are taking.  Some medicines may increase your risk of falling. General recommendations  Teach your family about your condition and how they can participate in your recovery. Try bringing your family members with you to a physical therapy session.  Take over-the-counter and prescription medicines only as told by your health care provider.  Keep all follow-up visits as told by your health care provider and rehab therapist. This is important. Questions to ask your health care provider  What exercises are safe for me to do?  How often should I do the exercises?  How can I manage pain during exercise?  What other activities are safe for me to do? Contact a health care provider if:  You have questions about how to do exercises correctly.  You have hip or groin pain that does not go away after resting and taking pain medicine.  Your artificial hip (prosthesis) feels loose.  You are not able to do exercises. Get help right away if:  You fall.  Your incision from surgery breaks open.  Your affected leg is shorter than the other leg, and this is a new problem. Summary  Hip rehab can help to strengthen your hip and improve the flexibility and movement of your hip joint.  Continue exercises at home that your health care provider or physical therapist instructed you to do in the hospital.  Contact your health care provider if you have questions about how to do exercises correctly. This information is not intended to replace advice given to you by your health care provider. Make sure you discuss any questions you have with your health care provider. Document Revised: 10/18/2017 Document Reviewed: 10/18/2017 Elsevier Patient Education  2020 [employer]vier Inc.  

## 2020-04-29 NOTE — Progress Notes (Signed)
Subjective:    Patient ID: Jonathan Sparks, male    DOB: September 03, 1941, 79 y.o.   MRN: [id number]  Chief Complaint  Patient presents with  . Medical Management of Chronic Issues  . Hypertension   Pt presents to the office today to establish care. He broke his left hip 01/16/20 after a fall. His soon reports he is doing well.  He has PVD.  Hypertension This is a chronic problem. The current episode started more than 1 year ago. The problem has been waxing and waning since onset. The problem is uncontrolled. Associated symptoms include anxiety and malaise/fatigue. Pertinent negatives include no peripheral edema or shortness of breath. Risk factors for coronary artery disease include dyslipidemia, obesity, male gender and sedentary lifestyle. The current treatment provides moderate improvement.  Depression        This is a chronic problem.  The current episode started more than 1 year ago.   The onset quality is gradual.   Associated symptoms include helplessness, hopelessness, irritable, restlessness and sad.  Past treatments include SSRIs - Selective serotonin reuptake inhibitors.  Past medical history includes anxiety.   Anxiety Presents for follow-up visit. Symptoms include excessive worry, irritability and restlessness. Patient reports no shortness of breath.    Hyperlipidemia This is a chronic problem. The current episode started more than 1 year ago. The problem is uncontrolled. Recent lipid tests were reviewed and are high. Pertinent negatives include no shortness of breath. Current antihyperlipidemic treatment includes statins. The current treatment provides moderate improvement of lipids. Risk factors for coronary artery disease include dyslipidemia, male sex and a sedentary lifestyle.  Nicotine Dependence Presents for follow-up visit. Symptoms include irritability. His urge triggers include company of smokers. He smokes < 1/2 a pack of cigarettes per day.      Review of Systems    Constitutional: Positive for irritability and malaise/fatigue.  Respiratory: Negative for shortness of breath.   Psychiatric/Behavioral: Positive for depression.       Objective:   Physical Exam Vitals reviewed.  Constitutional:      General: He is irritable. He is not in acute distress.    Appearance: He is well-developed.  HENT:     Head: Normocephalic.     Right Ear: Tympanic membrane normal.     Left Ear: Tympanic membrane normal.     Ears:     Comments: HOH Eyes:     General:        Right eye: No discharge.        Left eye: No discharge.     Pupils: Pupils are equal, round, and reactive to light.  Neck:     Thyroid: No thyromegaly.  Cardiovascular:     Rate and Rhythm: Normal rate and regular rhythm.     Heart sounds: Normal heart sounds. No murmur heard.   Pulmonary:     Effort: Pulmonary effort is normal. No respiratory distress.     Breath sounds: Normal breath sounds. No wheezing.  Abdominal:     General: Bowel sounds are normal. There is no distension.     Palpations: Abdomen is soft.     Tenderness: There is no abdominal tenderness.  Musculoskeletal:        General: No tenderness. Normal range of motion.     Cervical back: Normal range of motion and neck supple.     Right lower leg: Edema (trace) present.     Left lower leg: Edema (trace) present.     Comments: Discoloration of bilateral  legs, toe nails long and thick  Skin:    General: Skin is warm and dry.     Findings: No erythema or rash.  Neurological:     Mental Status: He is alert and oriented to person, place, and time.     Cranial Nerves: No cranial nerve deficit.     Deep Tendon Reflexes: Reflexes are normal and symmetric.  Psychiatric:        Behavior: Behavior normal.        Thought Content: Thought content normal.        Judgment: Judgment normal.       BP (!) 162/73   Pulse 77   Temp 98.4 F (36.9 C) (Temporal)   Ht 6' (1.829 m)   Wt 206 lb 3.2 oz (93.5 kg)   BMI 27.97 kg/m       Assessment & Plan:  Jonathan Sparks comes in today with chief complaint of Medical Management of Chronic Issues and Hypertension   Diagnosis and orders addressed:  1. Recurrent major depressive disorder, in full remission ([place]) - escitalopram (LEXAPRO) 10 MG tablet; Take 1 tablet (10 mg total) by mouth daily.  Dispense: 90 tablet; Refill: 2 - CMP14+EGFR - CBC with Differential/Platelet  2. Anxiety, generalized - escitalopram (LEXAPRO) 10 MG tablet; Take 1 tablet (10 mg total) by mouth daily.  Dispense: 90 tablet; Refill: 2 - CMP14+EGFR - CBC with Differential/Platelet  3. Mixed hyperlipidemia - atorvastatin (LIPITOR) 40 MG tablet; Take 1 tablet (40 mg total) by mouth at bedtime.  Dispense: 90 tablet; Refill: 1 - CMP14+EGFR - CBC with Differential/Platelet  4. Essential hypertension with goal blood pressure less than 130/80 - lisinopril (ZESTRIL) 10 MG tablet; Take 1 tablet (10 mg total) by mouth daily.  Dispense: 90 tablet; Refill: 3 - CMP14+EGFR - CBC with Differential/Platelet  5. Vitamin D deficiency - Vitamin D, Ergocalciferol, (DRISDOL) 1.25 MG (50000 UNIT) CAPS capsule; Take 1 capsule (50,000 Units total) by mouth every 7 (seven) days.  Dispense: 5 capsule; Refill: 6 - CMP14+EGFR - CBC with Differential/Platelet  6. Peripheral vascular disease with stasis dermatitis - CMP14+EGFR - CBC with Differential/Platelet - Ambulatory referral to Podiatry  7. Need for hepatitis C screening test - CMP14+EGFR - CBC with Differential/Platelet - Hepatitis C antibody  8. Toenail fungus - Ambulatory referral to [place] pending Health Maintenance reviewed Diet and exercise encouraged  Follow up plan: 6 months    Evelina Dun, FNP

## 2020-04-30 ENCOUNTER — Telehealth: Payer: Self-pay | Admitting: Family

## 2020-04-30 LAB — CMP14+EGFR
ALT: 17 IU/L (ref 0–44)
AST: 16 IU/L (ref 0–40)
Albumin/Globulin Ratio: 1.5 (ref 1.2–2.2)
Albumin: 4.3 g/dL (ref 3.7–4.7)
Alkaline Phosphatase: 89 IU/L (ref 48–121)
BUN/Creatinine Ratio: 14 (ref 10–24)
BUN: 16 mg/dL (ref 8–27)
Bilirubin Total: 0.5 mg/dL (ref 0.0–1.2)
CO2: 27 mmol/L (ref 20–29)
Calcium: 9.2 mg/dL (ref 8.6–10.2)
Chloride: 99 mmol/L (ref 96–106)
Creatinine, Ser: 1.13 mg/dL (ref 0.76–1.27)
GFR calc Af Amer: 72 mL/min/{1.73_m2} (ref >59)
GFR calc non Af Amer: 62 mL/min/{1.73_m2} (ref >59)
Globulin, Total: 2.8 g/dL (ref 1.5–4.5)
Glucose: 98 mg/dL (ref 65–99)
Potassium: 4.6 mmol/L (ref 3.5–5.2)
Sodium: 138 mmol/L (ref 134–144)
Total Protein: 7.1 g/dL (ref 6.0–8.5)

## 2020-04-30 LAB — CBC WITH DIFFERENTIAL/PLATELET
Basophils Absolute: 0 10*3/uL (ref 0.0–0.2)
Basos: 0 %
EOS (ABSOLUTE): 0.2 10*3/uL (ref 0.0–0.4)
Eos: 2 %
Hematocrit: 50.3 % (ref 37.5–51.0)
Hemoglobin: 17.1 g/dL (ref 13.0–17.7)
Immature Grans (Abs): 0 10*3/uL (ref 0.0–0.1)
Immature Granulocytes: 0 %
Lymphocytes Absolute: 1.8 10*3/uL (ref 0.7–3.1)
Lymphs: 18 %
MCH: 31.1 pg (ref 26.6–33.0)
MCHC: 34 g/dL (ref 31.5–35.7)
MCV: 92 fL (ref 79–97)
Monocytes Absolute: 0.7 10*3/uL (ref 0.1–0.9)
Monocytes: 7 %
Neutrophils Absolute: 7.5 10*3/uL — ABNORMAL HIGH (ref 1.4–7.0)
Neutrophils: 73 %
Platelets: 274 10*3/uL (ref 150–450)
RBC: 5.49 x10E6/uL (ref 4.14–5.80)
RDW: 12.8 % (ref 11.6–15.4)
WBC: 10.3 10*3/uL (ref 3.4–10.8)

## 2020-04-30 LAB — HEPATITIS C ANTIBODY: Hep C Virus Ab: <0.1 s/co ratio (ref 0.0–0.9)

## 2020-05-23 ENCOUNTER — Telehealth: Payer: Self-pay | Admitting: Family

## 2020-05-23 NOTE — Telephone Encounter (Signed)
Order being faxed back to [facility] fax number [phone] to be resigned

## 2020-05-23 NOTE — Telephone Encounter (Signed)
Message forwarded to pcp and home health for review.

## 2020-05-23 NOTE — Telephone Encounter (Signed)
Mariann Laster with [employer] calling to check status on a 420 Order being signed and sent back to them. Please advise.

## 2020-06-04 DIAGNOSIS — M79676 Pain in unspecified toe(s): Secondary | ICD-10-CM | POA: Diagnosis not present

## 2020-06-04 DIAGNOSIS — B351 Tinea unguium: Secondary | ICD-10-CM | POA: Diagnosis not present

## 2021-05-27 ENCOUNTER — Other Ambulatory Visit: Payer: Self-pay | Admitting: Family

## 2021-05-27 DIAGNOSIS — E782 Mixed hyperlipidemia: Secondary | ICD-10-CM

## 2021-05-27 DIAGNOSIS — I10 Essential (primary) hypertension: Secondary | ICD-10-CM

## 2021-05-27 DIAGNOSIS — F3342 Major depressive disorder, recurrent, in full remission: Secondary | ICD-10-CM

## 2021-05-27 DIAGNOSIS — F411 Generalized anxiety disorder: Secondary | ICD-10-CM

## 2021-05-28 ENCOUNTER — Telehealth: Payer: Self-pay | Admitting: Family

## 2021-05-28 DIAGNOSIS — F411 Generalized anxiety disorder: Secondary | ICD-10-CM

## 2021-05-28 DIAGNOSIS — F3342 Major depressive disorder, recurrent, in full remission: Secondary | ICD-10-CM

## 2021-05-28 DIAGNOSIS — E782 Mixed hyperlipidemia: Secondary | ICD-10-CM

## 2021-05-28 DIAGNOSIS — I10 Essential (primary) hypertension: Secondary | ICD-10-CM

## 2021-06-03 MED ORDER — LISINOPRIL 10 MG PO TABS: 10.0000 mg | ORAL_TABLET | Freq: Every day | ORAL | 0 refills | Status: DC

## 2021-06-03 MED ORDER — ATORVASTATIN CALCIUM 40 MG PO TABS: 40.0000 mg | ORAL_TABLET | Freq: Every day | ORAL | 0 refills | Status: DC

## 2021-06-03 MED ORDER — ESCITALOPRAM OXALATE 10 MG PO TABS: 10.0000 mg | ORAL_TABLET | Freq: Every day | ORAL | 0 refills | Status: DC

## 2021-06-03 NOTE — Telephone Encounter (Signed)
30 day supply sent

## 2021-06-03 NOTE — Addendum Note (Signed)
Addended by: Brynda Peon F on: 06/03/2021 11:31 AM   Modules accepted: Orders

## 2021-06-26 ENCOUNTER — Other Ambulatory Visit: Payer: Self-pay

## 2021-06-26 ENCOUNTER — Encounter: Payer: Self-pay | Admitting: Family

## 2021-06-26 ENCOUNTER — Ambulatory Visit (INDEPENDENT_AMBULATORY_CARE_PROVIDER_SITE_OTHER): Payer: Medicare Other | Admitting: Family

## 2021-06-26 DIAGNOSIS — H9193 Unspecified hearing loss, bilateral: Secondary | ICD-10-CM | POA: Diagnosis not present

## 2021-06-26 DIAGNOSIS — F17200 Nicotine dependence, unspecified, uncomplicated: Secondary | ICD-10-CM | POA: Diagnosis not present

## 2021-06-26 DIAGNOSIS — F411 Generalized anxiety disorder: Secondary | ICD-10-CM

## 2021-06-26 DIAGNOSIS — E559 Vitamin D deficiency, unspecified: Secondary | ICD-10-CM | POA: Diagnosis not present

## 2021-06-26 DIAGNOSIS — K5900 Constipation, unspecified: Secondary | ICD-10-CM

## 2021-06-26 DIAGNOSIS — I10 Essential (primary) hypertension: Secondary | ICD-10-CM

## 2021-06-26 DIAGNOSIS — H9190 Unspecified hearing loss, unspecified ear: Secondary | ICD-10-CM | POA: Insufficient documentation

## 2021-06-26 DIAGNOSIS — E782 Mixed hyperlipidemia: Secondary | ICD-10-CM | POA: Diagnosis not present

## 2021-06-26 DIAGNOSIS — F3342 Major depressive disorder, recurrent, in full remission: Secondary | ICD-10-CM | POA: Diagnosis not present

## 2021-06-26 MED ORDER — ATORVASTATIN CALCIUM 40 MG PO TABS: 40.0000 mg | ORAL_TABLET | Freq: Every day | ORAL | 3 refills | Status: DC

## 2021-06-26 MED ORDER — LISINOPRIL 10 MG PO TABS: 10.0000 mg | ORAL_TABLET | Freq: Every day | ORAL | 3 refills | Status: DC

## 2021-06-26 MED ORDER — BUSPIRONE HCL 5 MG PO TABS: 5.0000 mg | ORAL_TABLET | Freq: Two times a day (BID) | ORAL | 2 refills | Status: DC

## 2021-06-26 MED ORDER — VITAMIN D (ERGOCALCIFEROL) 1.25 MG (50000 UNIT) PO CAPS: 50000.0000 [IU] | ORAL_CAPSULE | ORAL | 6 refills | Status: DC

## 2021-06-26 MED ORDER — ESCITALOPRAM OXALATE 20 MG PO TABS: 20.0000 mg | ORAL_TABLET | Freq: Every day | ORAL | 3 refills | Status: DC

## 2021-06-26 NOTE — Progress Notes (Signed)
Subjective:    Patient ID: Jonathan Sparks, male    DOB: August 25, 1941, 80 y.o.   MRN: [id number]  Chief Complaint  Patient presents with   Medical Management of Chronic Issues   Pt presents to the office today for chronic follow up.  Hyperlipidemia This is a chronic problem. The problem is controlled. Recent lipid tests were reviewed and are normal. Exacerbating diseases include obesity. Current antihyperlipidemic treatment includes statins. The current treatment provides moderate improvement of lipids. Risk factors for coronary artery disease include dyslipidemia, male sex, hypertension and a sedentary lifestyle.  Nicotine Dependence Presents for follow-up visit. Symptoms include irritability. His urge triggers include company of smokers. The symptoms have been stable. He smokes < 1/2 a pack of cigarettes per day.  Depression        This is a chronic problem.  The current episode started more than 1 year ago.   The onset quality is gradual.   The problem occurs intermittently.  Associated symptoms include helplessness, hopelessness, irritable, restlessness and sad.  Past treatments include SSRIs - Selective serotonin reuptake inhibitors.  Compliance with treatment is good.  Past medical history includes anxiety.   Anxiety Presents for follow-up visit. Symptoms include depressed mood, excessive worry, irritability, nervous/anxious behavior and restlessness. Symptoms occur most days. The severity of symptoms is moderate.    Constipation This is a chronic problem. The current episode started more than 1 year ago. The problem has been waxing and waning since onset. His stool frequency is 1 time per day. He has tried stool softeners for the symptoms. The treatment provided moderate relief.  Hypertension This is a chronic problem. The current episode started more than 1 year ago. The problem has been waxing and waning since onset. The problem is uncontrolled. Associated symptoms include anxiety,  malaise/fatigue and peripheral edema. Risk factors for coronary artery disease include dyslipidemia, obesity and male gender. Past treatments include ACE inhibitors. The current treatment provides mild improvement.     Review of Systems  Constitutional:  Positive for irritability and malaise/fatigue.  Gastrointestinal:  Positive for constipation.  Psychiatric/Behavioral:  Positive for depression. The patient is nervous/anxious.   All other systems reviewed and are negative.     Objective:   Physical Exam Vitals reviewed.  Constitutional:      General: He is irritable. He is not in acute distress.    Appearance: He is well-developed.  HENT:     Head: Normocephalic.     Right Ear: Tympanic membrane normal.     Left Ear: Tympanic membrane normal.     Ears:     Comments: HOH  Eyes:     General:        Right eye: No discharge.        Left eye: No discharge.     Pupils: Pupils are equal, round, and reactive to light.  Neck:     Thyroid: No thyromegaly.  Cardiovascular:     Rate and Rhythm: Normal rate and regular rhythm.     Heart sounds: Normal heart sounds. No murmur heard. Pulmonary:     Effort: Pulmonary effort is normal. No respiratory distress.     Breath sounds: Normal breath sounds. No wheezing.  Abdominal:     General: Bowel sounds are normal. There is no distension.     Palpations: Abdomen is soft.     Tenderness: There is no abdominal tenderness.  Musculoskeletal:        General: No tenderness. Normal range of motion.  Cervical back: Normal range of motion and neck supple.  Skin:    General: Skin is warm and dry.     Findings: No erythema or rash.  Neurological:     Mental Status: He is alert and oriented to person, place, and time.     Cranial Nerves: No cranial nerve deficit.     Deep Tendon Reflexes: Reflexes are normal and symmetric.  Psychiatric:        Behavior: Behavior normal.        Thought Content: Thought content normal.        Judgment:  Judgment normal.         BP (!) 153/80   Pulse 76   Temp (!) 97.3 F (36.3 C) (Temporal)   Ht 6' (1.829 m)   Wt 217 lb 9.6 oz (98.7 kg)   SpO2 94%   BMI 29.51 kg/m   Assessment & Plan:  Jonathan Sparks comes in today with chief complaint of Medical Management of Chronic Issues   Diagnosis and orders addressed:  1. Recurrent major depressive disorder, in full remission ([place]) Will increase Lexapro to 20 mg from 10 mg  Will add Buspar 5 mg BID prn  Stress management  - escitalopram (LEXAPRO) 20 MG tablet; Take 1 tablet (20 mg total) by mouth daily.  Dispense: 90 tablet; Refill: 3 - busPIRone (BUSPAR) 5 MG tablet; Take 1 tablet (5 mg total) by mouth 2 (two) times daily.  Dispense: 60 tablet; Refill: 2 - CMP14+EGFR - CBC with Differential/Platelet  2. Anxiety, generalized - escitalopram (LEXAPRO) 20 MG tablet; Take 1 tablet (20 mg total) by mouth daily.  Dispense: 90 tablet; Refill: 3 - busPIRone (BUSPAR) 5 MG tablet; Take 1 tablet (5 mg total) by mouth 2 (two) times daily.  Dispense: 60 tablet; Refill: 2 - CMP14+EGFR - CBC with Differential/Platelet  3. Essential hypertension with goal blood pressure less than 130/80 - lisinopril (ZESTRIL) 10 MG tablet; Take 1 tablet (10 mg total) by mouth daily.  Dispense: 90 tablet; Refill: 3 - CMP14+EGFR - CBC with Differential/Platelet  4. Mixed hyperlipidemia - atorvastatin (LIPITOR) 40 MG tablet; Take 1 tablet (40 mg total) by mouth at bedtime.  Dispense: 90 tablet; Refill: 3 - CMP14+EGFR - CBC with Differential/Platelet  5. Vitamin D deficiency - Vitamin D, Ergocalciferol, (DRISDOL) 1.25 MG (50000 UNIT) CAPS capsule; Take 1 capsule (50,000 Units total) by mouth every 7 (seven) days.  Dispense: 5 capsule; Refill: 6 - CMP14+EGFR - CBC with Differential/Platelet  6. Constipation, unspecified constipation type - CMP14+EGFR - CBC with Differential/Platelet  7. Current smoker - CMP14+EGFR - CBC with  Differential/Platelet  8. Bilateral hearing loss, unspecified hearing loss type Pt reports this is giving him a lot of anxiety because he can not hear and can not understand people - Ambulatory referral to Audiology - CMP14+EGFR - CBC with Differential/Platelet   Labs pending Health Maintenance reviewed Diet and exercise encouraged  Follow up plan: 1 month to recheck GAD   Evelina Dun, FNP

## 2021-06-26 NOTE — Patient Instructions (Signed)

## 2021-06-27 LAB — CMP14+EGFR
ALT: 17 IU/L (ref 0–44)
AST: 16 IU/L (ref 0–40)
Albumin/Globulin Ratio: 1.5 (ref 1.2–2.2)
Albumin: 4.4 g/dL (ref 3.7–4.7)
Alkaline Phosphatase: 97 IU/L (ref 44–121)
BUN/Creatinine Ratio: 13 (ref 10–24)
BUN: 14 mg/dL (ref 8–27)
Bilirubin Total: 0.7 mg/dL (ref 0.0–1.2)
CO2: 28 mmol/L (ref 20–29)
Calcium: 8.7 mg/dL (ref 8.6–10.2)
Chloride: 99 mmol/L (ref 96–106)
Creatinine, Ser: 1.12 mg/dL (ref 0.76–1.27)
Globulin, Total: 2.9 g/dL (ref 1.5–4.5)
Glucose: 102 mg/dL — ABNORMAL HIGH (ref 65–99)
Potassium: 4.7 mmol/L (ref 3.5–5.2)
Sodium: 139 mmol/L (ref 134–144)
Total Protein: 7.3 g/dL (ref 6.0–8.5)
eGFR: 67 mL/min/{1.73_m2} (ref >59)

## 2021-06-27 LAB — CBC WITH DIFFERENTIAL/PLATELET
Basophils Absolute: 0.1 10*3/uL (ref 0.0–0.2)
Basos: 1 %
EOS (ABSOLUTE): 0.2 10*3/uL (ref 0.0–0.4)
Eos: 2 %
Hematocrit: 55.1 % — ABNORMAL HIGH (ref 37.5–51.0)
Hemoglobin: 18.3 g/dL — ABNORMAL HIGH (ref 13.0–17.7)
Immature Grans (Abs): 0 10*3/uL (ref 0.0–0.1)
Immature Granulocytes: 0 %
Lymphocytes Absolute: 1.8 10*3/uL (ref 0.7–3.1)
Lymphs: 18 %
MCH: 30.7 pg (ref 26.6–33.0)
MCHC: 33.2 g/dL (ref 31.5–35.7)
MCV: 92 fL (ref 79–97)
Monocytes Absolute: 0.8 10*3/uL (ref 0.1–0.9)
Monocytes: 8 %
Neutrophils Absolute: 7.2 10*3/uL — ABNORMAL HIGH (ref 1.4–7.0)
Neutrophils: 71 %
Platelets: 262 10*3/uL (ref 150–450)
RBC: 5.96 x10E6/uL — ABNORMAL HIGH (ref 4.14–5.80)
RDW: 12.6 % (ref 11.6–15.4)
WBC: 10.2 10*3/uL (ref 3.4–10.8)

## 2021-07-07 ENCOUNTER — Telehealth: Payer: Self-pay | Admitting: Family

## 2021-07-09 NOTE — Telephone Encounter (Signed)
Ok

## 2021-07-22 ENCOUNTER — Other Ambulatory Visit: Payer: Self-pay

## 2021-07-22 ENCOUNTER — Ambulatory Visit: Payer: Medicare Other | Attending: Family | Admitting: Audiologist

## 2021-07-22 DIAGNOSIS — Z9621 Cochlear implant status: Secondary | ICD-10-CM | POA: Insufficient documentation

## 2021-07-22 NOTE — Progress Notes (Signed)
Patient ID: Jonathan Sparks, male   DOB: 03-20-1941, 80 y.o.   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 Former patient of Dr. Thornell Mule, ENT. I referred him to Dr. Doyle Askew at [place] who took over Dr. Mayer Masker patient load. According to [facility] DR. Doyle Askew is who he saw at that office. I also put in a referral for [employer] to get a new Advertising account planner. His son [name] was given Dr. Tedra Senegal information for his CI follow up and the number for [employer]. No care was provided. No charge for visit as [facility] is not equipped to repair cochlear implants or other devices.  Alfonse Alpers Au.D. CCC-A Audiologist

## 2021-08-01 ENCOUNTER — Encounter: Payer: Self-pay | Admitting: Family

## 2021-08-01 ENCOUNTER — Ambulatory Visit (INDEPENDENT_AMBULATORY_CARE_PROVIDER_SITE_OTHER): Payer: Medicare Other | Admitting: Family

## 2021-08-01 ENCOUNTER — Other Ambulatory Visit: Payer: Self-pay

## 2021-08-01 VITALS — BP 126/70 | HR 94 | Temp 97.4°F | Resp 20 | Ht 72.0 in | Wt 213.0 lb

## 2021-08-01 DIAGNOSIS — F3342 Major depressive disorder, recurrent, in full remission: Secondary | ICD-10-CM

## 2021-08-01 DIAGNOSIS — F411 Generalized anxiety disorder: Secondary | ICD-10-CM

## 2021-08-01 DIAGNOSIS — H9193 Unspecified hearing loss, bilateral: Secondary | ICD-10-CM | POA: Diagnosis not present

## 2021-08-01 NOTE — Progress Notes (Signed)
Subjective:    Patient ID: Jonathan Sparks, male    DOB: 11-21-1940, 80 y.o.   MRN: [id number]  Chief Complaint  Patient presents with   Depression/anxiety follow up   Pt presents to the office today to recheck Depression. He was seen on 06/26/21 and we increased his Lexapro to 20 mg from 10 mg and added Buspar 5 mg BID prn.  Anxiety Presents for follow-up visit. Symptoms include depressed mood, excessive worry, irritability, nervous/anxious behavior and restlessness. Symptoms occur most days. The severity of symptoms is moderate.    Depression        This is a chronic problem.  The current episode started more than 1 year ago.   Associated symptoms include irritable and restlessness.  Associated symptoms include no helplessness, no hopelessness and not sad.  Past treatments include SSRIs - Selective serotonin reuptake inhibitors.  Past medical history includes anxiety.      Review of Systems  Constitutional:  Positive for irritability.  Psychiatric/Behavioral:  Positive for depression. The patient is nervous/anxious.   All other systems reviewed and are negative.     Objective:   Physical Exam Vitals reviewed.  Constitutional:      General: He is irritable. He is not in acute distress.    Appearance: He is well-developed. He is obese.  HENT:     Head: Normocephalic.     Ears:     Comments: HOH  Eyes:     General:        Right eye: No discharge.        Left eye: No discharge.     Pupils: Pupils are equal, round, and reactive to light.  Neck:     Thyroid: No thyromegaly.  Cardiovascular:     Rate and Rhythm: Normal rate and regular rhythm.     Heart sounds: Normal heart sounds. No murmur heard. Pulmonary:     Effort: Pulmonary effort is normal. No respiratory distress.     Breath sounds: Normal breath sounds. No wheezing.  Abdominal:     General: Bowel sounds are normal. There is no distension.     Palpations: Abdomen is soft.     Tenderness: There is no  abdominal tenderness.  Musculoskeletal:        General: No tenderness. Normal range of motion.     Cervical back: Normal range of motion and neck supple.  Skin:    General: Skin is warm and dry.     Findings: No erythema or rash.  Neurological:     Mental Status: He is alert and oriented to person, place, and time.     Cranial Nerves: No cranial nerve deficit.     Deep Tendon Reflexes: Reflexes are normal and symmetric.  Psychiatric:        Behavior: Behavior normal.        Thought Content: Thought content normal.        Judgment: Judgment normal.         BP 126/70   Pulse 94   Temp (!) 97.4 F (36.3 C) (Temporal)   Resp 20   Ht 6' (1.829 m)   Wt 213 lb (96.6 kg)   SpO2 95%   BMI 28.89 kg/m   Assessment & Plan:  Jonathan Sparks comes in today with chief complaint of Depression/anxiety follow up   Diagnosis and orders addressed:  1. Recurrent major depressive disorder, in full remission ([place])  2. Anxiety, generalized  3. Bilateral hearing loss, unspecified hearing loss  type    Continue current dose of lexapro 20 mg and Buspar 5 mg BID prn.  Anxiety stable Has appointment for evaluation of cochlear implant to help with [place] Maintenance reviewed Diet and exercise encouraged  Follow up plan: 6 months    Jonathan Dun, FNP

## 2021-08-01 NOTE — Patient Instructions (Signed)
Generalized Anxiety Disorder, Adult Generalized anxiety disorder (GAD) is a mental health condition. Unlike normal worries, anxiety related to GAD is not triggered by a specific event. These worries do not fade or get better with time. GAD interferes with relationships, work, and school. GAD symptoms can vary from mild to severe. People with severe GAD can have intense waves of anxiety with physical symptoms that are similar to panic attacks. What are the causes? The exact cause of GAD is not known, but the following are believed to have an impact: Differences in natural brain chemicals. Genes passed down from parents to children. Differences in the way threats are perceived. Development during childhood. Personality. What increases the risk? The following factors may make you more likely to develop this condition: Being male. Having a family history of anxiety disorders. Being very shy. Experiencing very stressful life events, such as the death of a loved one. Having a very stressful family environment. What are the signs or symptoms? People with GAD often worry excessively about many things in their lives, such as their health and family. Symptoms may also include: Mental and emotional symptoms: Worrying excessively about natural disasters. Fear of being late. Difficulty concentrating. Fears that others are judging your performance. Physical symptoms: Fatigue. Headaches, muscle tension, muscle twitches, trembling, or feeling shaky. Feeling like your heart is pounding or beating very fast. Feeling out of breath or like you cannot take a deep breath. Having trouble falling asleep or staying asleep, or experiencing restlessness. Sweating. Nausea, diarrhea, or irritable bowel syndrome (IBS). Behavioral symptoms: Experiencing erratic moods or irritability. Avoidance of new situations. Avoidance of people. Extreme difficulty making decisions. How is this diagnosed? This condition  is diagnosed based on your symptoms and medical history. You will also have a physical exam. Your health care provider may perform tests to rule out other possible causes of your symptoms. To be diagnosed with GAD, a person must have anxiety that: Is out of his or her control. Affects several different aspects of his or her life, such as work and relationships. Causes distress that makes him or her unable to take part in normal activities. Includes at least three symptoms of GAD, such as restlessness, fatigue, trouble concentrating, irritability, muscle tension, or sleep problems. Before your health care provider can confirm a diagnosis of GAD, these symptoms must be present more days than they are not, and they must last for 6 months or longer. How is this treated? This condition may be treated with: Medicine. Antidepressant medicine is usually prescribed for long-term daily control. Anti-anxiety medicines may be added in severe cases, especially when panic attacks occur. Talk therapy (psychotherapy). Certain types of talk therapy can be helpful in treating GAD by providing support, education, and guidance. Options include: Cognitive behavioral therapy (CBT). People learn coping skills and self-calming techniques to ease their physical symptoms. They learn to identify unrealistic thoughts and behaviors and to replace them with more appropriate thoughts and behaviors. Acceptance and commitment therapy (ACT). This treatment teaches people how to be mindful as a way to cope with unwanted thoughts and feelings. Biofeedback. This process trains you to manage your body's response (physiological response) through breathing techniques and relaxation methods. You will work with a therapist while machines are used to monitor your physical symptoms. Stress management techniques. These include yoga, meditation, and exercise. A mental health specialist can help determine which treatment is best for you. Some  people see improvement with one type of therapy. However, other people require a combination   of therapies. Follow these instructions at home: Lifestyle Maintain a consistent routine and schedule. Anticipate stressful situations. Create a plan, and allow extra time to work with your plan. Practice stress management or self-calming techniques that you have learned from your therapist or your health care provider. General instructions Take over-the-counter and prescription medicines only as told by your health care provider. Understand that you are likely to have setbacks. Accept this and be kind to yourself as you persist to take better care of yourself. Recognize and accept your accomplishments, even if you judge them as small. Keep all follow-up visits as told by your health care provider. This is important. Contact a health care provider if: Your symptoms do not get better. Your symptoms get worse. You have signs of depression, such as: A persistently sad or irritable mood. Loss of enjoyment in activities that used to bring you joy. Change in weight or eating. Changes in sleeping habits. Avoiding friends or family members. Loss of energy for normal tasks. Feelings of guilt or worthlessness. Get help right away if: You have serious thoughts about hurting yourself or others. If you ever feel like you may hurt yourself or others, or have thoughts about taking your own life, get help right away. Go to your nearest emergency department or: Call your local emergency services (911 in the [place] Call a suicide crisis helpline, such as the National Suicide Prevention Lifeline at 1-800-273-8255. This is open 24 hours a day in the [place] Text the Crisis Text Line at 741741 (in the [place] Summary Generalized anxiety disorder (GAD) is a mental health condition that involves worry that is not triggered by a specific event. People with GAD often worry excessively about many things in their lives, such  as their health and family. GAD may cause symptoms such as restlessness, trouble concentrating, sleep problems, frequent sweating, nausea, diarrhea, headaches, and trembling or muscle twitching. A mental health specialist can help determine which treatment is best for you. Some people see improvement with one type of therapy. However, other people require a combination of therapies. This information is not intended to replace advice given to you by your health care provider. Make sure you discuss any questions you have with your health care provider. Document Revised: 08/23/2019 Document Reviewed: 08/23/2019 Elsevier Patient Education  2022 Elsevier Inc.  

## 2021-09-15 DIAGNOSIS — Z45321 Encounter for adjustment and management of cochlear device: Secondary | ICD-10-CM | POA: Diagnosis not present

## 2021-10-07 ENCOUNTER — Other Ambulatory Visit: Payer: Self-pay | Admitting: Family

## 2021-10-07 DIAGNOSIS — F3342 Major depressive disorder, recurrent, in full remission: Secondary | ICD-10-CM

## 2021-10-07 DIAGNOSIS — F411 Generalized anxiety disorder: Secondary | ICD-10-CM

## 2021-10-08 ENCOUNTER — Telehealth: Payer: Self-pay | Admitting: Family

## 2021-10-08 NOTE — Telephone Encounter (Signed)
Left message for pt to call back to schedule his 6 mth follow up with PCP (due by 01/29/2022). Pt is also due for AWV.

## 2021-11-26 ENCOUNTER — Ambulatory Visit: Payer: Medicare Other

## 2021-11-26 ENCOUNTER — Telehealth: Payer: Self-pay | Admitting: Family

## 2021-11-28 IMAGING — CR DG HIP (WITH OR WITHOUT PELVIS) 2-3V*L*
3 series · 3 of 3 positions shown · non-contrast
Comparison: None.

CLINICAL DATA: 79-year-old male with fall and left hip pain.

EXAM:
DG HIP (WITH OR WITHOUT PELVIS) 2-3V LEFT

[pelvis ap]
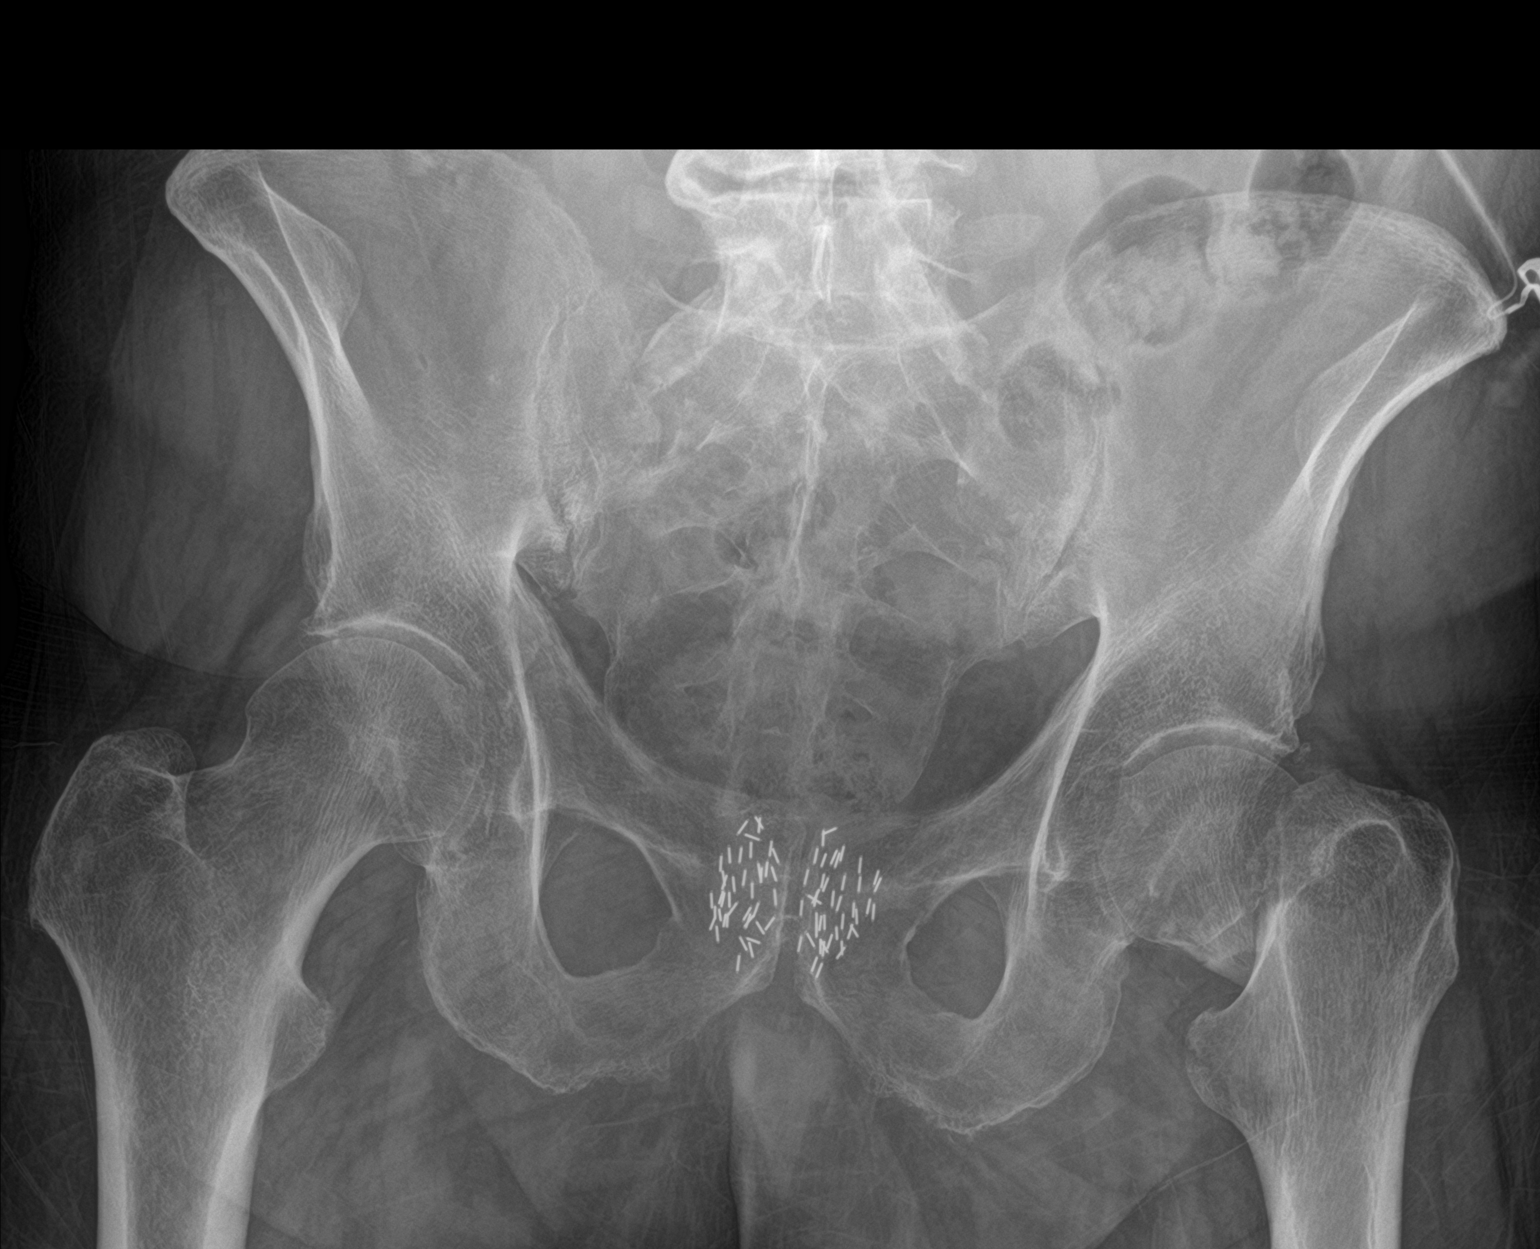

[hip ap]
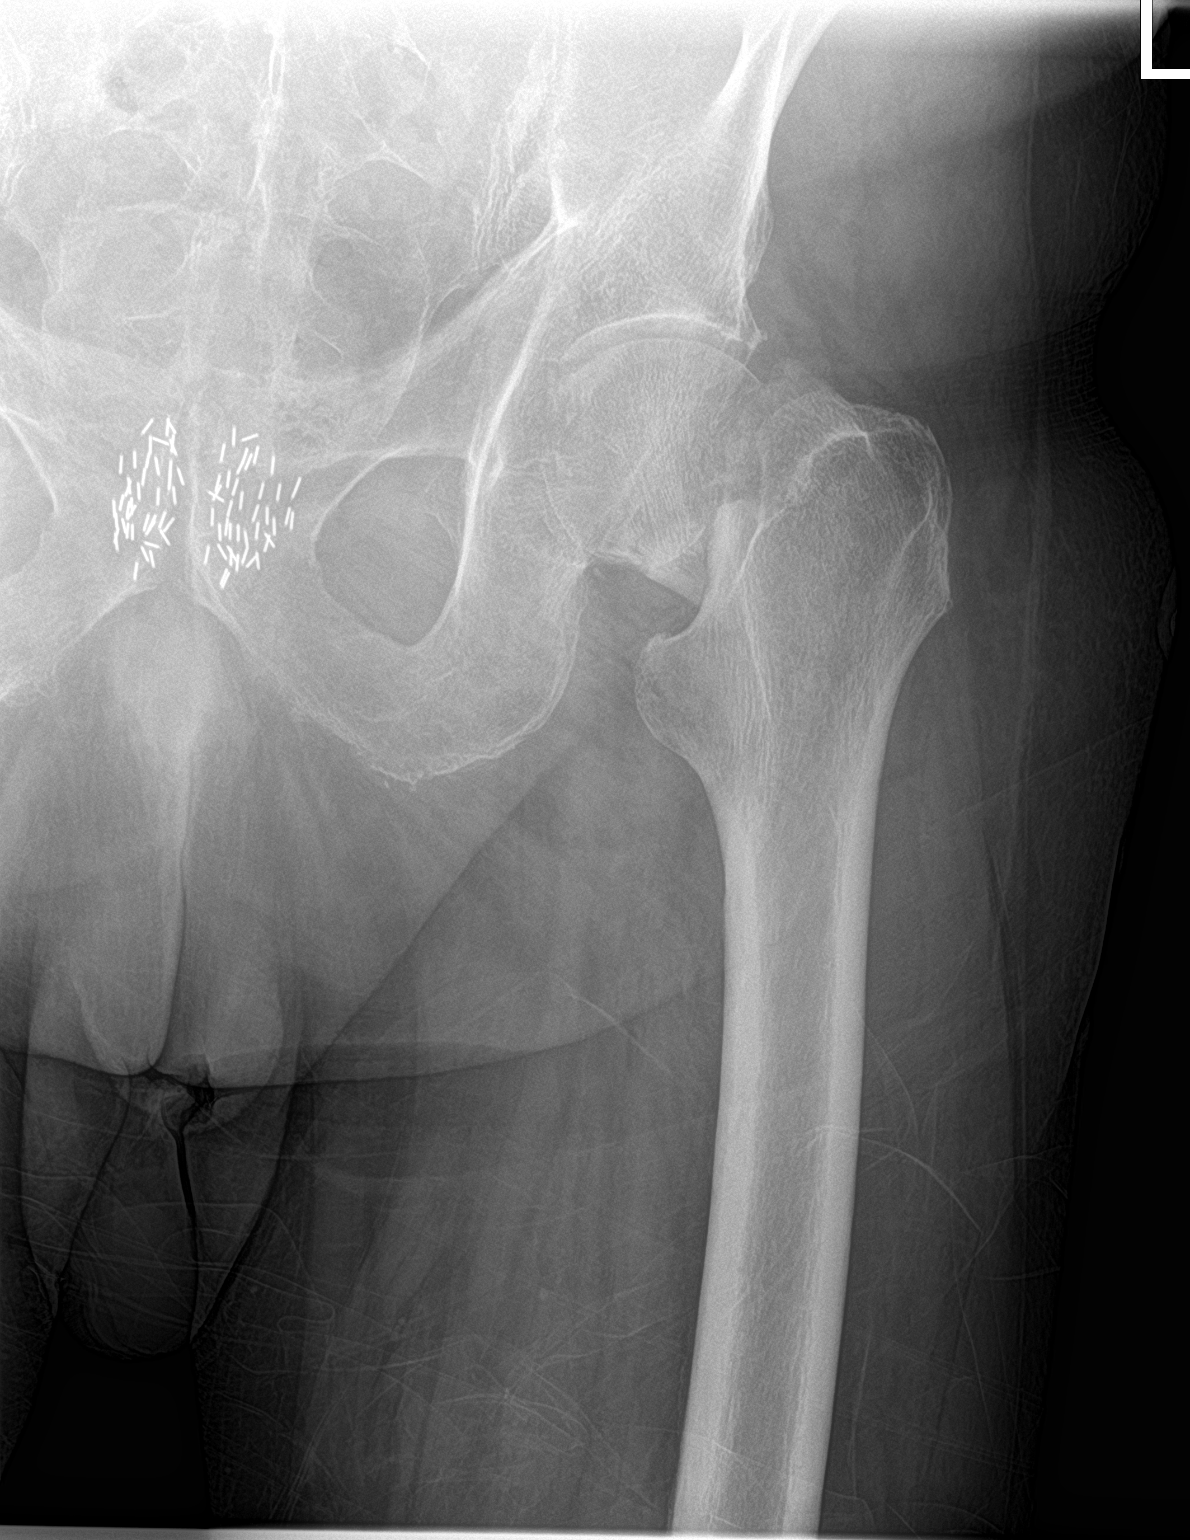

[hip x-table]
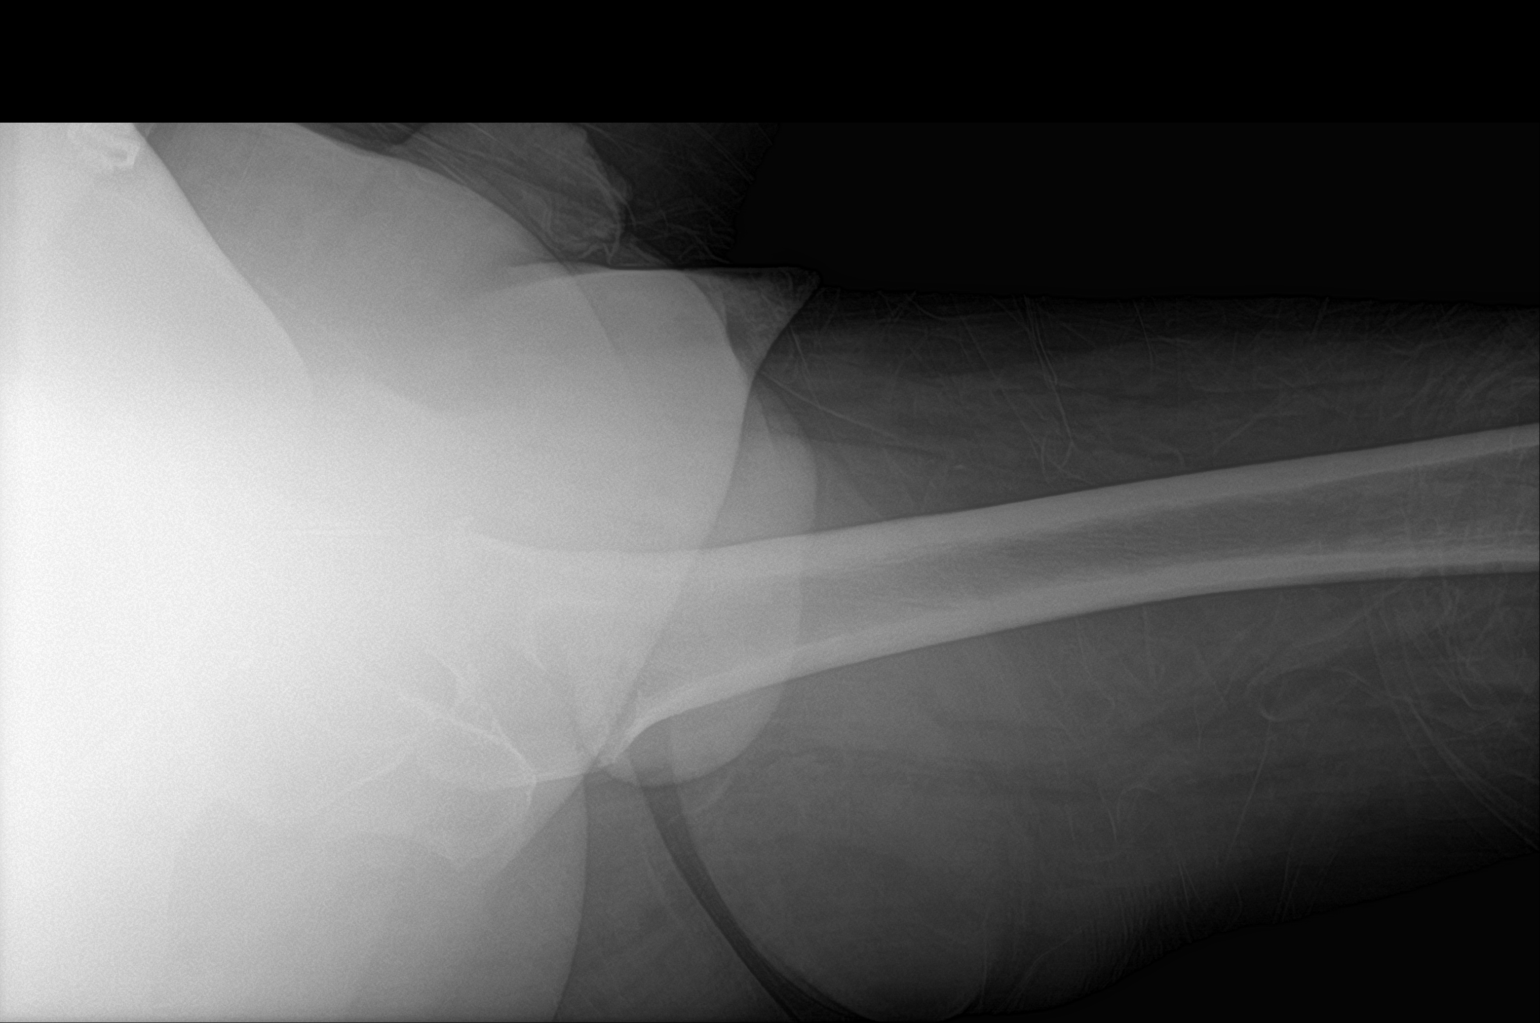

[3 of 3 positions shown; findings below may reference images not displayed]

FINDINGS: There is a mildly displaced fracture of the left femoral neck. No
other acute fracture identified. The bones are osteopenic. No
dislocation. Degenerative changes of the lower lumbar spine. The
soft tissues are unremarkable. Prostate brachytherapy seeds noted.
IMPRESSION: Mildly displaced left femoral neck fracture.

## 2021-11-28 IMAGING — CR DG CHEST 1V
1 series · 1 of 1 positions shown · non-contrast
Comparison: 03/29/2007

CLINICAL DATA: Fall with hip pain

EXAM:
CHEST  1 VIEW

[chest ap]
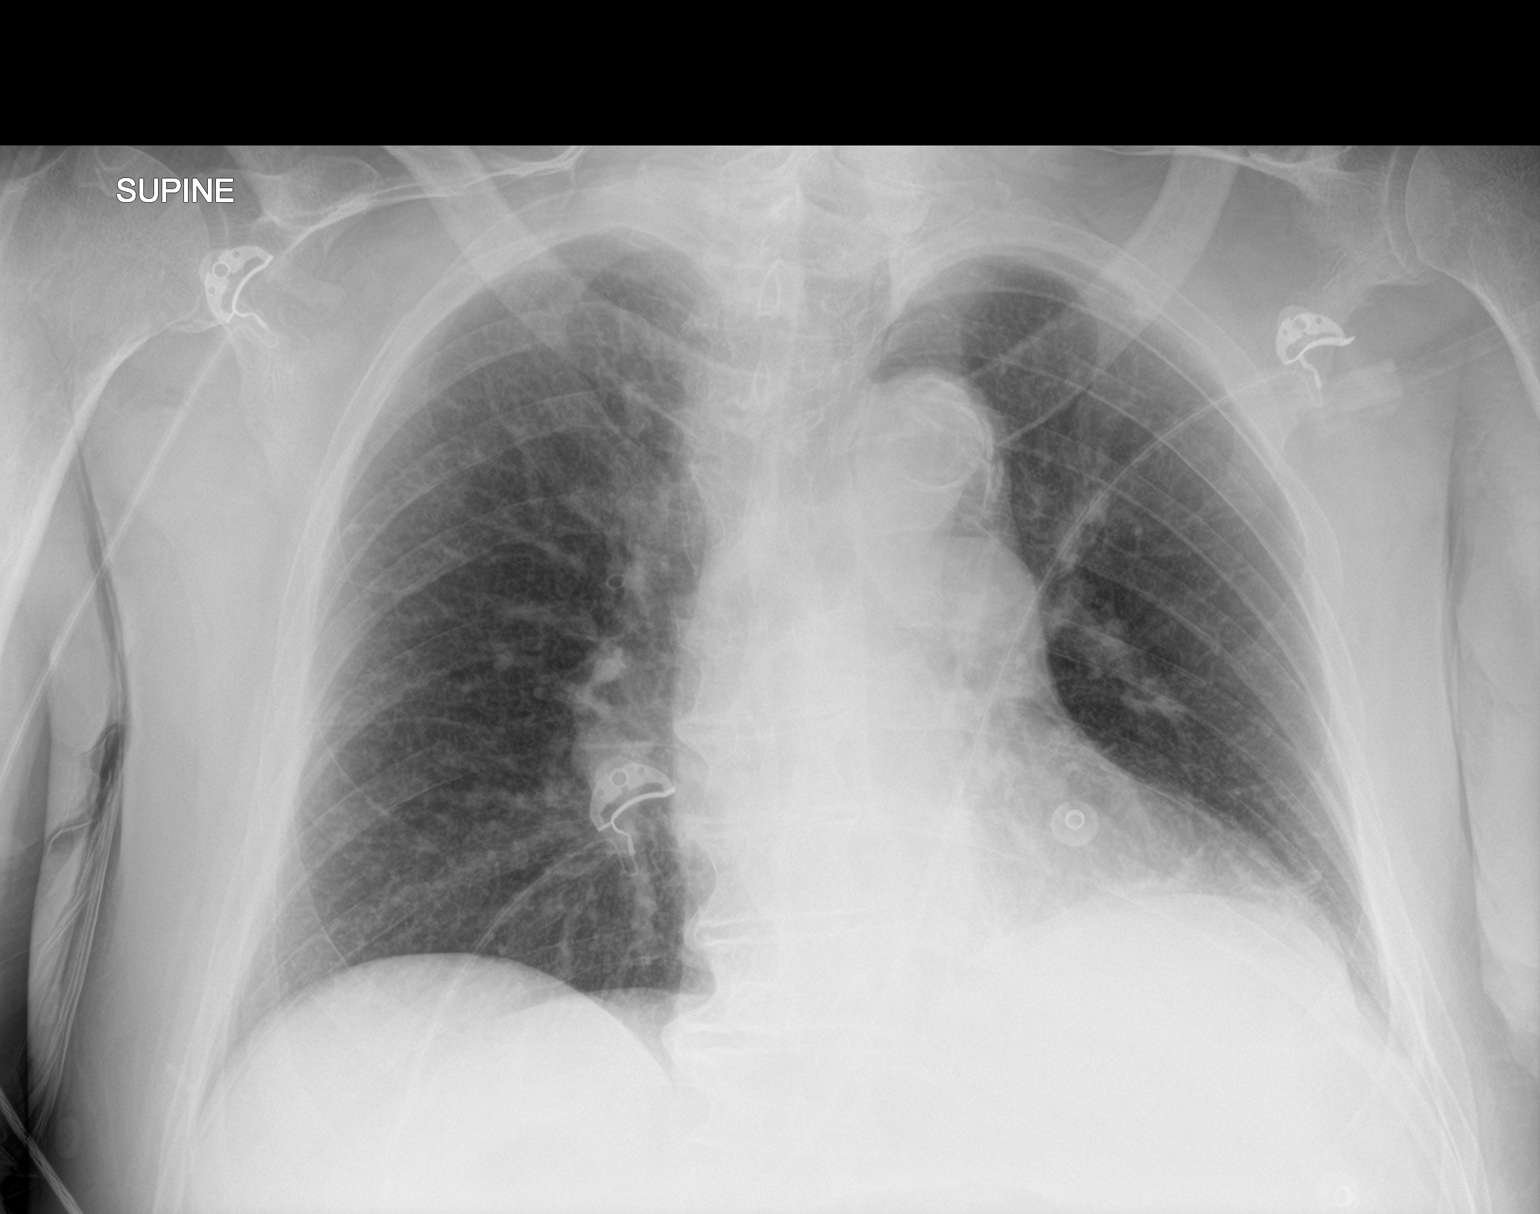

[1 of 1 positions shown; findings below may reference images not displayed]

FINDINGS: Right lung is clear. Slightly elevated left diaphragm with
atelectasis at the left base. Mild cardiomegaly with aortic
atherosclerosis. Negative for pleural effusion or pneumothorax.
IMPRESSION: 1. Cardiomegaly without edema or infiltrate
2. Linear atelectasis or scarring at the left base

## 2021-11-30 IMAGING — RF DG HIP (WITH PELVIS) OPERATIVE*L*
1 series · 2 of 2 positions shown · non-contrast
Comparison: 01/16/2020

CLINICAL DATA: Total left hip arthroplasty

EXAM:
DG C-ARM 1-60 MIN; OPERATIVE LEFT HIP WITH PELVIS

[Series 1: run · 2 of 2 slices shown]
[im 1/2]
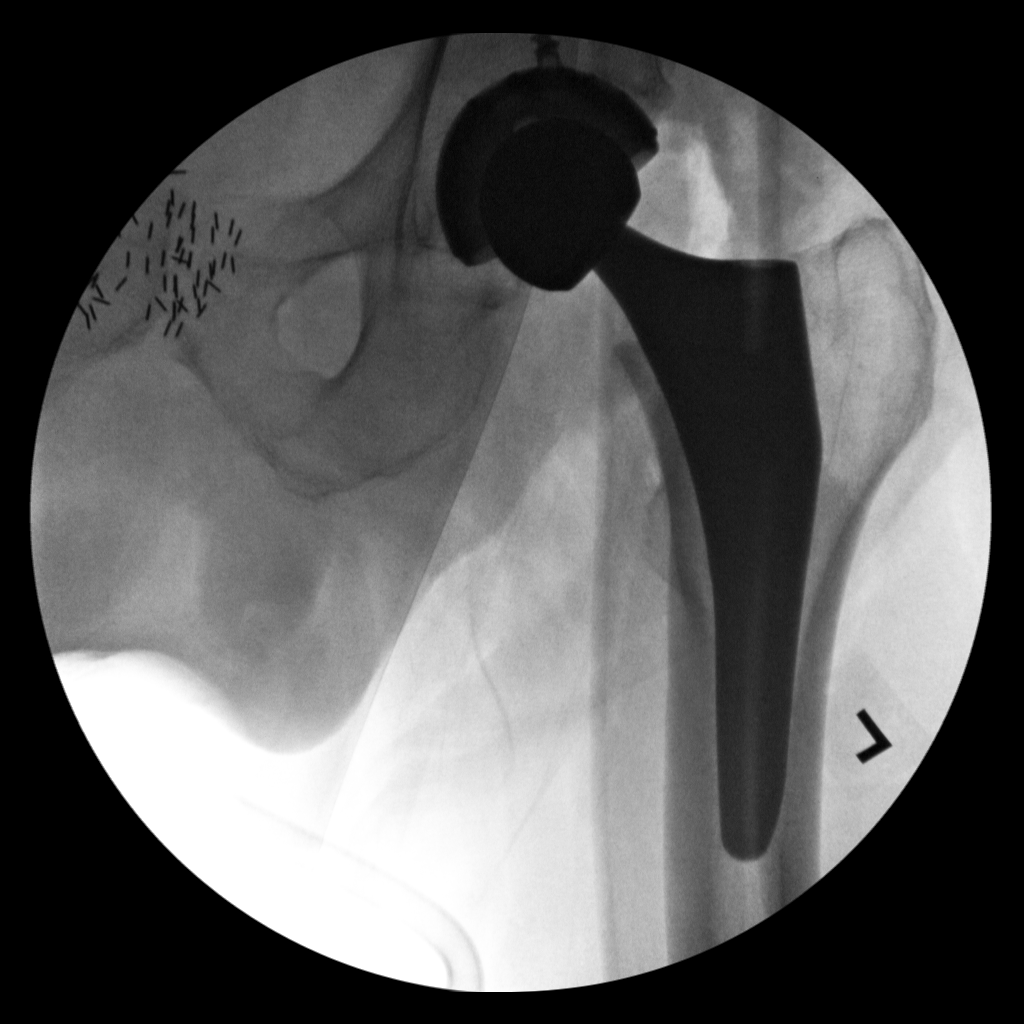
[im 2/2]
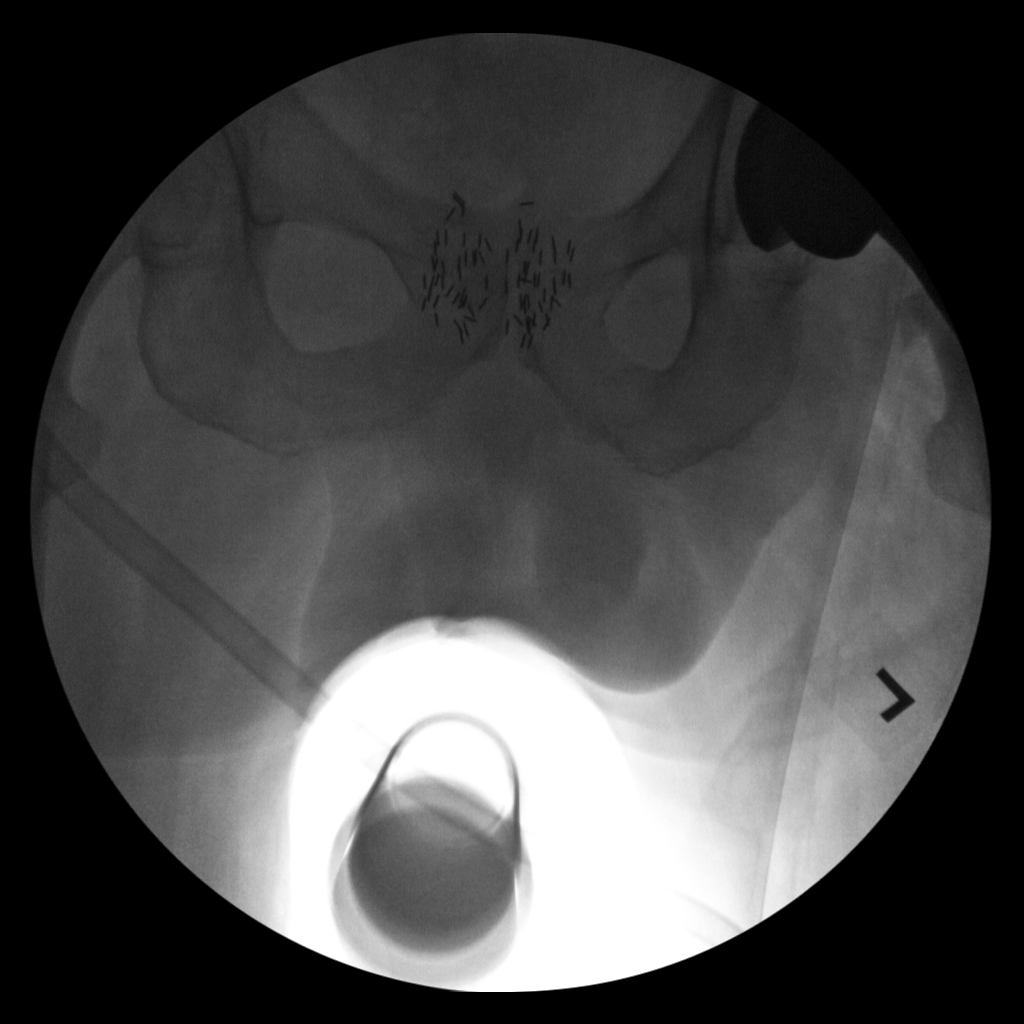

[2 of 2 positions shown; findings below may reference images not displayed]

FINDINGS: Changes of left hip replacement. Normal AP alignment. No hardware
bony complicating feature. Radiation seeds in the region of the
prostate.
IMPRESSION: Left hip replacement.  No visible complicating feature.

## 2021-11-30 IMAGING — RF DG C-ARM 1-60 MIN
1 series · 2 of 2 positions shown · non-contrast
Comparison: 01/16/2020

CLINICAL DATA: Total left hip arthroplasty

EXAM:
DG C-ARM 1-60 MIN; OPERATIVE LEFT HIP WITH PELVIS

[Series 1: run · 2 of 2 slices shown]
[im 1/2]
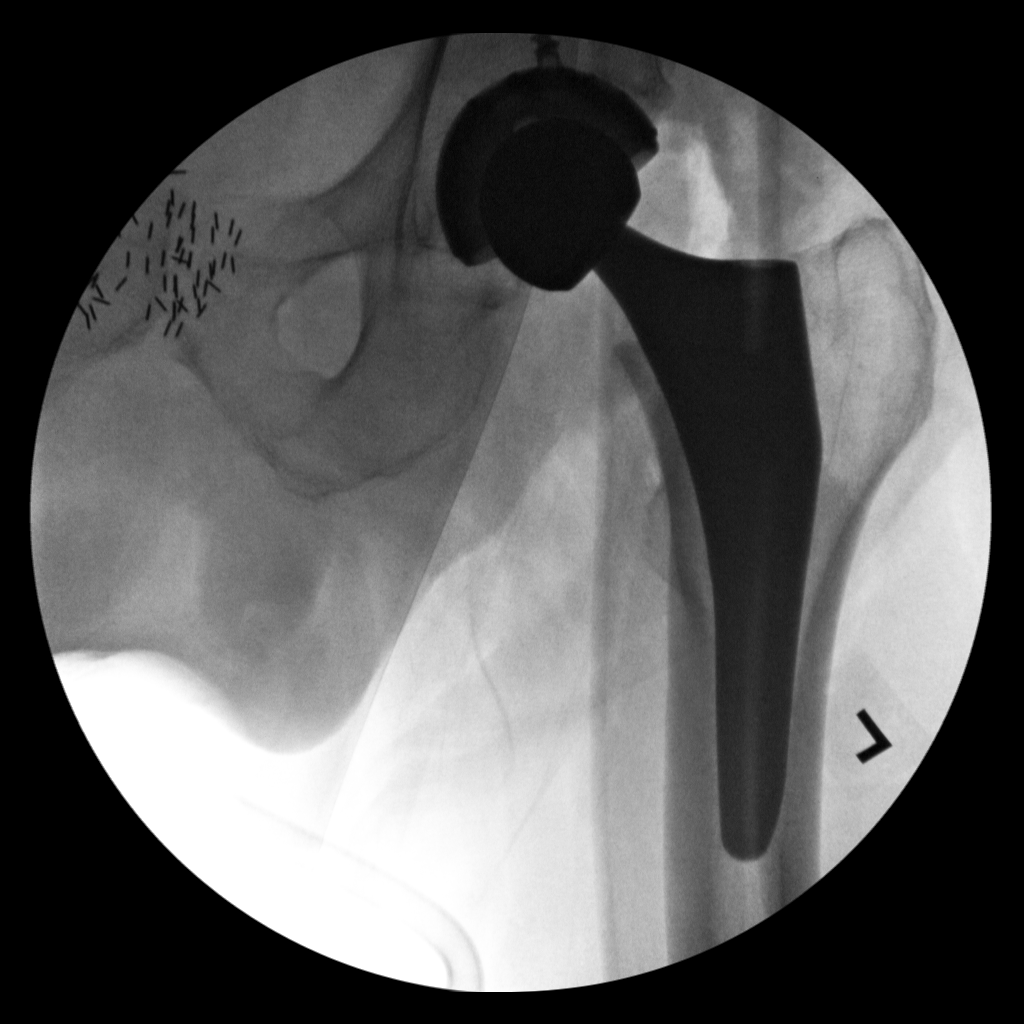
[im 2/2]
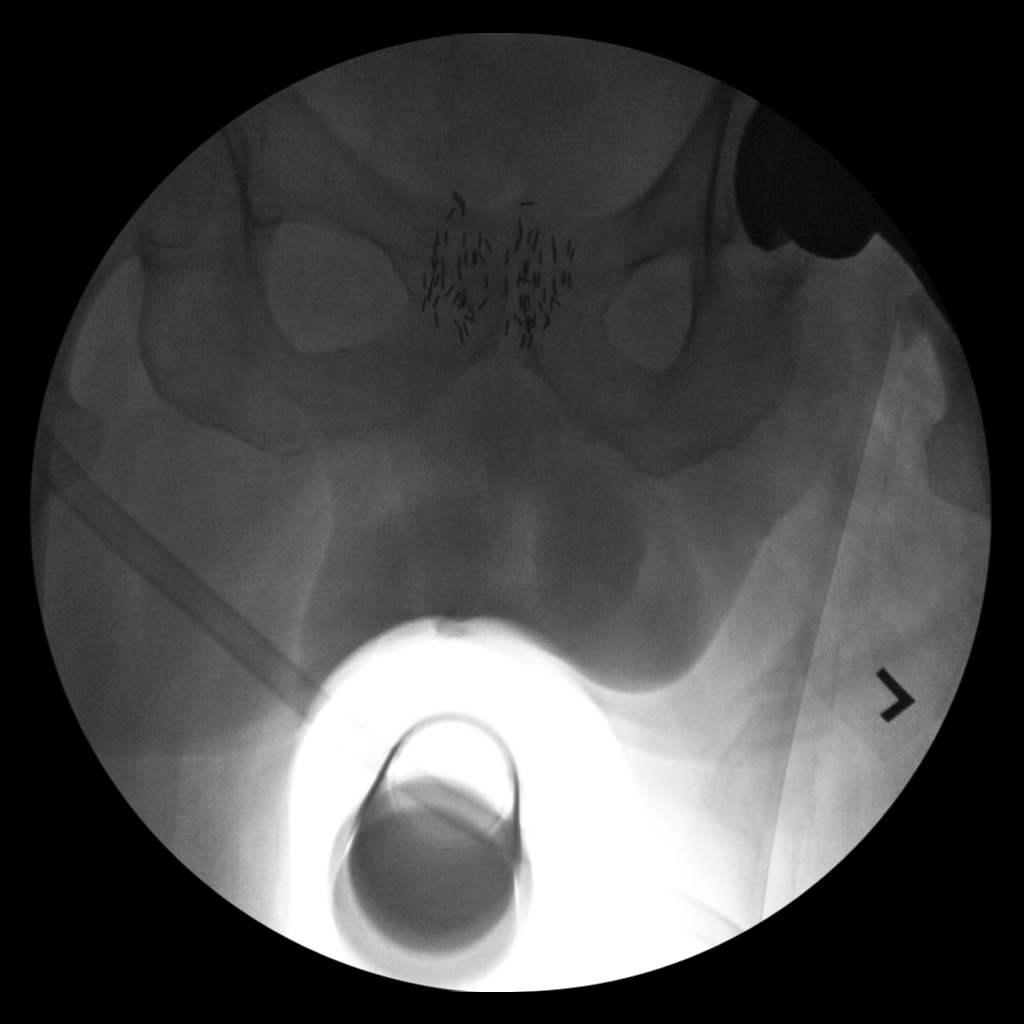

[2 of 2 positions shown; findings below may reference images not displayed]

FINDINGS: Changes of left hip replacement. Normal AP alignment. No hardware
bony complicating feature. Radiation seeds in the region of the
prostate.
IMPRESSION: Left hip replacement.  No visible complicating feature.

## 2021-12-09 ENCOUNTER — Ambulatory Visit: Payer: Medicare Other

## 2022-01-21 ENCOUNTER — Ambulatory Visit: Payer: Medicare Other

## 2022-01-21 ENCOUNTER — Telehealth: Payer: Self-pay

## 2022-01-21 NOTE — Telephone Encounter (Signed)
Pt was scheduled for AWV today at 1:15. LM on pt's home and cell numbers to return call. No call back. Thank you. Mjp,lpn ?

## 2022-04-16 ENCOUNTER — Ambulatory Visit: Payer: Medicare Other

## 2022-05-05 ENCOUNTER — Other Ambulatory Visit: Payer: Self-pay | Admitting: Family

## 2022-05-05 DIAGNOSIS — E559 Vitamin D deficiency, unspecified: Secondary | ICD-10-CM

## 2022-06-19 ENCOUNTER — Other Ambulatory Visit: Payer: Self-pay | Admitting: Family

## 2022-06-19 DIAGNOSIS — E559 Vitamin D deficiency, unspecified: Secondary | ICD-10-CM

## 2022-06-26 ENCOUNTER — Other Ambulatory Visit: Payer: Self-pay | Admitting: Family

## 2022-06-26 DIAGNOSIS — I10 Essential (primary) hypertension: Secondary | ICD-10-CM

## 2022-06-26 DIAGNOSIS — E782 Mixed hyperlipidemia: Secondary | ICD-10-CM

## 2022-07-28 ENCOUNTER — Other Ambulatory Visit: Payer: Self-pay | Admitting: Family

## 2022-07-28 DIAGNOSIS — I10 Essential (primary) hypertension: Secondary | ICD-10-CM

## 2022-07-28 DIAGNOSIS — E782 Mixed hyperlipidemia: Secondary | ICD-10-CM

## 2022-07-28 NOTE — Telephone Encounter (Signed)
30 days given 06/25/2022 - ntbs

## 2022-07-29 MED ORDER — LISINOPRIL 10 MG PO TABS: 10.0000 mg | ORAL_TABLET | Freq: Every day | ORAL | 0 refills | Status: DC

## 2022-07-29 MED ORDER — ATORVASTATIN CALCIUM 40 MG PO TABS: 40.0000 mg | ORAL_TABLET | Freq: Every day | ORAL | 0 refills | Status: DC

## 2022-07-29 NOTE — Telephone Encounter (Signed)
Pt made appt on 09/26 with [facility]

## 2022-07-29 NOTE — Addendum Note (Signed)
Addended by: Antonietta Barcelona D on: 07/29/2022 11:34 AM   Modules accepted: Orders

## 2022-08-11 ENCOUNTER — Encounter: Payer: Self-pay | Admitting: Family

## 2022-08-11 ENCOUNTER — Ambulatory Visit (INDEPENDENT_AMBULATORY_CARE_PROVIDER_SITE_OTHER): Payer: Medicare Other | Admitting: Family

## 2022-08-11 VITALS — BP 140/81 | HR 75 | Temp 97.6°F | Ht 72.0 in | Wt 223.0 lb

## 2022-08-11 DIAGNOSIS — I10 Essential (primary) hypertension: Secondary | ICD-10-CM

## 2022-08-11 DIAGNOSIS — H9193 Unspecified hearing loss, bilateral: Secondary | ICD-10-CM

## 2022-08-11 DIAGNOSIS — Z0000 Encounter for general adult medical examination without abnormal findings: Secondary | ICD-10-CM | POA: Diagnosis not present

## 2022-08-11 DIAGNOSIS — F17200 Nicotine dependence, unspecified, uncomplicated: Secondary | ICD-10-CM | POA: Diagnosis not present

## 2022-08-11 DIAGNOSIS — E559 Vitamin D deficiency, unspecified: Secondary | ICD-10-CM | POA: Diagnosis not present

## 2022-08-11 DIAGNOSIS — F411 Generalized anxiety disorder: Secondary | ICD-10-CM | POA: Diagnosis not present

## 2022-08-11 DIAGNOSIS — H6123 Impacted cerumen, bilateral: Secondary | ICD-10-CM

## 2022-08-11 DIAGNOSIS — Z0001 Encounter for general adult medical examination with abnormal findings: Secondary | ICD-10-CM

## 2022-08-11 DIAGNOSIS — F3342 Major depressive disorder, recurrent, in full remission: Secondary | ICD-10-CM

## 2022-08-11 DIAGNOSIS — E782 Mixed hyperlipidemia: Secondary | ICD-10-CM

## 2022-08-11 DIAGNOSIS — K5900 Constipation, unspecified: Secondary | ICD-10-CM | POA: Diagnosis not present

## 2022-08-11 MED ORDER — LISINOPRIL 10 MG PO TABS: 10.0000 mg | ORAL_TABLET | Freq: Every day | ORAL | 2 refills | Status: DC

## 2022-08-11 MED ORDER — ATORVASTATIN CALCIUM 40 MG PO TABS: 40.0000 mg | ORAL_TABLET | Freq: Every day | ORAL | 2 refills | Status: DC

## 2022-08-11 MED ORDER — VITAMIN D (ERGOCALCIFEROL) 1.25 MG (50000 UNIT) PO CAPS: ORAL_CAPSULE | ORAL | 2 refills | Status: DC

## 2022-08-11 MED ORDER — BUSPIRONE HCL 5 MG PO TABS: 5.0000 mg | ORAL_TABLET | Freq: Two times a day (BID) | ORAL | 2 refills | Status: DC

## 2022-08-11 MED ORDER — ESCITALOPRAM OXALATE 20 MG PO TABS: 20.0000 mg | ORAL_TABLET | Freq: Every day | ORAL | 3 refills | Status: DC

## 2022-08-11 NOTE — Patient Instructions (Signed)
Health Maintenance After Age 79 After age 79, you are at a higher risk for certain long-term diseases and infections as well as injuries from falls. Falls are a major cause of broken bones and head injuries in people who are older than age 79. Getting regular preventive care can help to keep you healthy and well. Preventive care includes getting regular testing and making lifestyle changes as recommended by your health care provider. Talk with your health care provider about: Which screenings and tests you should have. A screening is a test that checks for a disease when you have no symptoms. A diet and exercise plan that is right for you. What should I know about screenings and tests to prevent falls? Screening and testing are the best ways to find a health problem early. Early diagnosis and treatment give you the best chance of managing medical conditions that are common after age 79. Certain conditions and lifestyle choices may make you more likely to have a fall. Your health care provider may recommend: Regular vision checks. Poor vision and conditions such as cataracts can make you more likely to have a fall. If you wear glasses, make sure to get your prescription updated if your vision changes. Medicine review. Work with your health care provider to regularly review all of the medicines you are taking, including over-the-counter medicines. Ask your health care provider about any side effects that may make you more likely to have a fall. Tell your health care provider if any medicines that you take make you feel dizzy or sleepy. Strength and balance checks. Your health care provider may recommend certain tests to check your strength and balance while standing, walking, or changing positions. Foot health exam. Foot pain and numbness, as well as not wearing proper footwear, can make you more likely to have a fall. Screenings, including: Osteoporosis screening. Osteoporosis is a condition that causes  the bones to get weaker and break more easily. Blood pressure screening. Blood pressure changes and medicines to control blood pressure can make you feel dizzy. Depression screening. You may be more likely to have a fall if you have a fear of falling, feel depressed, or feel unable to do activities that you used to do. Alcohol use screening. Using too much alcohol can affect your balance and may make you more likely to have a fall. Follow these instructions at home: Lifestyle Do not drink alcohol if: Your health care provider tells you not to drink. If you drink alcohol: Limit how much you have to: 0-1 drink a day for women. 0-2 drinks a day for men. Know how much alcohol is in your drink. In the [place] one drink equals one 12 oz bottle of beer (355 mL), one 5 oz glass of wine (148 mL), or one 1 oz glass of hard liquor (44 mL). Do not use any products that contain nicotine or tobacco. These products include cigarettes, chewing tobacco, and vaping devices, such as e-cigarettes. If you need help quitting, ask your health care provider. Activity  Follow a regular exercise program to stay fit. This will help you maintain your balance. Ask your health care provider what types of exercise are appropriate for you. If you need a cane or walker, use it as recommended by your health care provider. Wear supportive shoes that have nonskid soles. Safety  Remove any tripping hazards, such as rugs, cords, and clutter. Install safety equipment such as grab bars in bathrooms and safety rails on stairs. Keep rooms and walkways   well-lit. General instructions Talk with your health care provider about your risks for falling. Tell your health care provider if: You fall. Be sure to tell your health care provider about all falls, even ones that seem minor. You feel dizzy, tiredness (fatigue), or off-balance. Take over-the-counter and prescription medicines only as told by your health care provider. These include  supplements. Eat a healthy diet and maintain a healthy weight. A healthy diet includes low-fat dairy products, low-fat (lean) meats, and fiber from whole grains, beans, and lots of fruits and vegetables. Stay current with your vaccines. Schedule regular health, dental, and eye exams. Summary Having a healthy lifestyle and getting preventive care can help to protect your health and wellness after age 79. Screening and testing are the best way to find a health problem early and help you avoid having a fall. Early diagnosis and treatment give you the best chance for managing medical conditions that are more common for people who are older than age 79. Falls are a major cause of broken bones and head injuries in people who are older than age 79. Take precautions to prevent a fall at home. Work with your health care provider to learn what changes you can make to improve your health and wellness and to prevent falls. This information is not intended to replace advice given to you by your health care provider. Make sure you discuss any questions you have with your health care provider. Document Revised: 03/24/2021 Document Reviewed: 03/24/2021 Elsevier Patient Education  2023 Elsevier Inc.  

## 2022-08-11 NOTE — Progress Notes (Signed)
Subjective:    Patient ID: Jonathan Sparks, male    DOB: 09/16/1941, 81 y.o.   MRN: [id number]  Chief Complaint  Patient presents with   Medical Management of Chronic Issues    No concerns    PT presents to the office today for CPE. He is HOH.   Hypertension This is a chronic problem. The current episode started more than 1 year ago. The problem has been resolved since onset. The problem is uncontrolled. Associated symptoms include anxiety. Pertinent negatives include no malaise/fatigue, peripheral edema or shortness of breath. Risk factors for coronary artery disease include dyslipidemia, obesity, male gender and sedentary lifestyle. The current treatment provides moderate improvement.  Depression        This is a chronic problem.  The current episode started more than 1 year ago.   The onset quality is gradual.   Associated symptoms include no helplessness, no hopelessness and not sad.  Past treatments include SSRIs - Selective serotonin reuptake inhibitors.  Past medical history includes anxiety.   Hyperlipidemia This is a chronic problem. The current episode started more than 1 year ago. Exacerbating diseases include obesity. Pertinent negatives include no shortness of breath. Current antihyperlipidemic treatment includes statins. The current treatment provides moderate improvement of lipids.  Nicotine Dependence Presents for follow-up visit. Symptoms are negative for irritability. His urge triggers include company of smokers. The symptoms have been stable. He smokes 1 pack of cigarettes per day.  Constipation This is a chronic problem. The current episode started more than 1 year ago. The problem has been waxing and waning since onset. His stool frequency is 1 time per day. He has tried diet changes and laxatives for the symptoms.  Anxiety Presents for follow-up visit. Symptoms include excessive worry and nervous/anxious behavior. Patient reports no irritability or shortness of breath.  Symptoms occur most days. The severity of symptoms is moderate.       Review of Systems  Constitutional:  Negative for irritability and malaise/fatigue.  Respiratory:  Negative for shortness of breath.   Gastrointestinal:  Positive for constipation.  Psychiatric/Behavioral:  Positive for depression. The patient is nervous/anxious.   All other systems reviewed and are negative.  Social History   Socioeconomic History   Marital status: Widowed    Spouse name: Not on file   Number of children: 2   Years of education: Not on file   Highest education level: Not on file  Occupational History   Occupation: retired   Tobacco Use   Smoking status: Every Day    Packs/day: 0.50    Types: Cigarettes   Smokeless tobacco: Never  Vaping Use   Vaping Use: Never used  Substance and Sexual Activity   Alcohol use: No   Drug use: No   Sexual activity: Not on file  Other Topics Concern   Not on file  Social History Narrative   Lives in his home - 1 son lives with him    Social Determinants of Health   Financial Resource Strain: Not on file  Food Insecurity: Not on file  Transportation Needs: Not on file  Physical Activity: Not on file  Stress: Not on file  Social Connections: Not on file   Family History  Problem Relation Age of Onset   Alcohol abuse Father    Heart attack Brother    Hyperlipidemia Son    Hypertension Son         Objective:   Physical Exam Vitals reviewed.  Constitutional:  General: He is not in acute distress.    Appearance: He is well-developed. He is obese.  HENT:     Head: Normocephalic.     Right Ear: There is impacted cerumen.     Left Ear: There is impacted cerumen.     Ears:     Comments: HOH Eyes:     General:        Right eye: No discharge.        Left eye: No discharge.     Pupils: Pupils are equal, round, and reactive to light.  Neck:     Thyroid: No thyromegaly.  Cardiovascular:     Rate and Rhythm: Normal rate and regular  rhythm.     Heart sounds: Normal heart sounds. No murmur heard. Pulmonary:     Effort: Pulmonary effort is normal. No respiratory distress.     Breath sounds: Normal breath sounds. No wheezing.  Abdominal:     General: Bowel sounds are normal. There is no distension.     Palpations: Abdomen is soft.     Tenderness: There is no abdominal tenderness.  Musculoskeletal:        General: No tenderness. Normal range of motion.     Cervical back: Normal range of motion and neck supple.  Skin:    General: Skin is warm and dry.     Findings: No erythema or rash.  Neurological:     Mental Status: He is alert and oriented to person, place, and time.     Cranial Nerves: No cranial nerve deficit.     Deep Tendon Reflexes: Reflexes are normal and symmetric.  Psychiatric:        Behavior: Behavior normal.        Thought Content: Thought content normal.        Judgment: Judgment normal.   Bilateral ears washed with warm water and peroxide. TM normal.   BP (!) 140/81   Pulse 75   Temp 97.6 F (36.4 C) (Temporal)   Ht 6' (1.829 m)   Wt 223 lb (101.2 kg)   SpO2 91%   BMI 30.24 kg/m       Assessment & Plan:  Jonathan Sparks comes in today with chief complaint of Medical Management of Chronic Issues (No concerns )   Diagnosis and orders addressed:  1. Recurrent major depressive disorder, in full remission ([place]) - busPIRone (BUSPAR) 5 MG tablet; Take 1 tablet (5 mg total) by mouth 2 (two) times daily.  Dispense: 180 tablet; Refill: 2 - escitalopram (LEXAPRO) 20 MG tablet; Take 1 tablet (20 mg total) by mouth daily.  Dispense: 90 tablet; Refill: 3 - CMP14+EGFR - CBC with Differential/Platelet  2. Anxiety, generalized - busPIRone (BUSPAR) 5 MG tablet; Take 1 tablet (5 mg total) by mouth 2 (two) times daily.  Dispense: 180 tablet; Refill: 2 - escitalopram (LEXAPRO) 20 MG tablet; Take 1 tablet (20 mg total) by mouth daily.  Dispense: 90 tablet; Refill: 3 - CMP14+EGFR - CBC with  Differential/Platelet  3. Essential hypertension with goal blood pressure less than 130/80 - lisinopril (ZESTRIL) 10 MG tablet; Take 1 tablet (10 mg total) by mouth daily.  Dispense: 90 tablet; Refill: 2 - CMP14+EGFR - CBC with Differential/Platelet  4. Mixed hyperlipidemia - atorvastatin (LIPITOR) 40 MG tablet; Take 1 tablet (40 mg total) by mouth daily.  Dispense: 90 tablet; Refill: 2 - CMP14+EGFR - CBC with Differential/Platelet  5. Vitamin D deficiency - Vitamin D, Ergocalciferol, (DRISDOL) 1.25 MG (50000 UNIT) CAPS capsule;  TAKE ONE CAPSULE EVERY 7 DAYS (NEEDS TO BE SEEN BEFORE NEXT REFILL)  Dispense: 7 capsule; Refill: 2 - CMP14+EGFR - CBC with Differential/Platelet - VITAMIN D 25 Hydroxy (Vit-D Deficiency, Fractures)  6. Annual physical exam - CMP14+EGFR - CBC with Differential/Platelet - Lipid panel - TSH - VITAMIN D 25 Hydroxy (Vit-D Deficiency, Fractures)  7. Bilateral hearing loss, unspecified hearing loss type - CMP14+EGFR - CBC with Differential/Platelet  8. Current smoker - CMP14+EGFR  9. Constipation, unspecified constipation type - CMP14+EGFR - CBC with Differential/Platelet  10. Bilateral impacted cerumen    Labs pending Health Maintenance reviewed Diet and exercise encouraged  Follow up plan: 6 months   Evelina Dun, FNP

## 2022-08-12 LAB — CMP14+EGFR
ALT: 24 IU/L (ref 0–44)
AST: 20 IU/L (ref 0–40)
Albumin/Globulin Ratio: 1.7 (ref 1.2–2.2)
Albumin: 4.5 g/dL (ref 3.8–4.8)
Alkaline Phosphatase: 104 IU/L (ref 44–121)
BUN/Creatinine Ratio: 15 (ref 10–24)
BUN: 16 mg/dL (ref 8–27)
Bilirubin Total: 0.7 mg/dL (ref 0.0–1.2)
CO2: 26 mmol/L (ref 20–29)
Calcium: 9.3 mg/dL (ref 8.6–10.2)
Chloride: 97 mmol/L (ref 96–106)
Creatinine, Ser: 1.07 mg/dL (ref 0.76–1.27)
Globulin, Total: 2.6 g/dL (ref 1.5–4.5)
Glucose: 87 mg/dL (ref 70–99)
Potassium: 4.9 mmol/L (ref 3.5–5.2)
Sodium: 140 mmol/L (ref 134–144)
Total Protein: 7.1 g/dL (ref 6.0–8.5)
eGFR: 71 mL/min/{1.73_m2} (ref >59)

## 2022-08-12 LAB — CBC WITH DIFFERENTIAL/PLATELET
Basophils Absolute: 0.1 10*3/uL (ref 0.0–0.2)
Basos: 1 %
EOS (ABSOLUTE): 0.2 10*3/uL (ref 0.0–0.4)
Eos: 2 %
Hematocrit: 53.7 % — ABNORMAL HIGH (ref 37.5–51.0)
Hemoglobin: 18.2 g/dL — ABNORMAL HIGH (ref 13.0–17.7)
Immature Grans (Abs): 0.1 10*3/uL (ref 0.0–0.1)
Immature Granulocytes: 1 %
Lymphocytes Absolute: 1.9 10*3/uL (ref 0.7–3.1)
Lymphs: 20 %
MCH: 31.4 pg (ref 26.6–33.0)
MCHC: 33.9 g/dL (ref 31.5–35.7)
MCV: 93 fL (ref 79–97)
Monocytes Absolute: 0.8 10*3/uL (ref 0.1–0.9)
Monocytes: 9 %
Neutrophils Absolute: 6.6 10*3/uL (ref 1.4–7.0)
Neutrophils: 67 %
Platelets: 241 10*3/uL (ref 150–450)
RBC: 5.79 x10E6/uL (ref 4.14–5.80)
RDW: 12.7 % (ref 11.6–15.4)
WBC: 9.6 10*3/uL (ref 3.4–10.8)

## 2022-08-12 LAB — LIPID PANEL
Chol/HDL Ratio: 3.2 ratio (ref 0.0–5.0)
Cholesterol, Total: 118 mg/dL (ref 100–199)
HDL: 37 mg/dL — ABNORMAL LOW (ref >39)
LDL Chol Calc (NIH): 59 mg/dL (ref 0–99)
Triglycerides: 124 mg/dL (ref 0–149)
VLDL Cholesterol Cal: 22 mg/dL (ref 5–40)

## 2022-08-12 LAB — VITAMIN D 25 HYDROXY (VIT D DEFICIENCY, FRACTURES): Vit D, 25-Hydroxy: 57.3 ng/mL (ref 30.0–100.0)

## 2022-08-12 LAB — TSH: TSH: 1.1 u[IU]/mL (ref 0.450–4.500)

## 2022-10-21 ENCOUNTER — Telehealth: Payer: Self-pay | Admitting: Family Medicine

## 2022-10-22 NOTE — Telephone Encounter (Signed)
No return call 

## 2023-02-25 ENCOUNTER — Other Ambulatory Visit: Payer: Self-pay | Admitting: Family

## 2023-02-25 DIAGNOSIS — E559 Vitamin D deficiency, unspecified: Secondary | ICD-10-CM

## 2023-05-06 DIAGNOSIS — M79676 Pain in unspecified toe(s): Secondary | ICD-10-CM | POA: Diagnosis not present

## 2023-05-06 DIAGNOSIS — B351 Tinea unguium: Secondary | ICD-10-CM | POA: Diagnosis not present

## 2023-07-09 ENCOUNTER — Other Ambulatory Visit: Payer: Self-pay | Admitting: Family

## 2023-07-09 DIAGNOSIS — I10 Essential (primary) hypertension: Secondary | ICD-10-CM

## 2023-07-09 DIAGNOSIS — E782 Mixed hyperlipidemia: Secondary | ICD-10-CM

## 2023-08-17 ENCOUNTER — Other Ambulatory Visit: Payer: Self-pay | Admitting: Family

## 2023-08-17 ENCOUNTER — Encounter: Payer: Self-pay | Admitting: Family

## 2023-08-17 DIAGNOSIS — E782 Mixed hyperlipidemia: Secondary | ICD-10-CM

## 2023-08-17 DIAGNOSIS — I10 Essential (primary) hypertension: Secondary | ICD-10-CM

## 2023-08-17 NOTE — Telephone Encounter (Signed)
[facility] NTBS Last OV 08/11/22 NO RF sent to pharmacy since last OV greater than a year

## 2023-08-17 NOTE — Telephone Encounter (Signed)
NA. Letter mailed 

## 2023-10-07 DIAGNOSIS — B351 Tinea unguium: Secondary | ICD-10-CM | POA: Diagnosis not present

## 2023-10-07 DIAGNOSIS — M79676 Pain in unspecified toe(s): Secondary | ICD-10-CM | POA: Diagnosis not present

## 2023-10-07 DIAGNOSIS — I70203 Unspecified atherosclerosis of native arteries of extremities, bilateral legs: Secondary | ICD-10-CM | POA: Diagnosis not present

## 2023-10-07 DIAGNOSIS — L84 Corns and callosities: Secondary | ICD-10-CM | POA: Diagnosis not present

## 2024-01-13 DIAGNOSIS — B351 Tinea unguium: Secondary | ICD-10-CM | POA: Diagnosis not present

## 2024-01-13 DIAGNOSIS — L84 Corns and callosities: Secondary | ICD-10-CM | POA: Diagnosis not present

## 2024-01-13 DIAGNOSIS — E1151 Type 2 diabetes mellitus with diabetic peripheral angiopathy without gangrene: Secondary | ICD-10-CM | POA: Diagnosis not present

## 2024-01-13 DIAGNOSIS — M79676 Pain in unspecified toe(s): Secondary | ICD-10-CM | POA: Diagnosis not present

## 2024-03-23 DIAGNOSIS — B351 Tinea unguium: Secondary | ICD-10-CM | POA: Diagnosis not present

## 2024-03-23 DIAGNOSIS — L84 Corns and callosities: Secondary | ICD-10-CM | POA: Diagnosis not present

## 2024-03-23 DIAGNOSIS — M79675 Pain in left toe(s): Secondary | ICD-10-CM | POA: Diagnosis not present

## 2024-03-23 DIAGNOSIS — M79674 Pain in right toe(s): Secondary | ICD-10-CM | POA: Diagnosis not present

## 2024-03-23 DIAGNOSIS — E1151 Type 2 diabetes mellitus with diabetic peripheral angiopathy without gangrene: Secondary | ICD-10-CM | POA: Diagnosis not present

## 2024-04-11 ENCOUNTER — Ambulatory Visit: Payer: Self-pay

## 2024-04-11 NOTE — Telephone Encounter (Signed)
 Information obtained from daughter inlaw Jaclyn.   Chief Complaint: bilateral foot swelling Symptoms: swelling Frequency: unsure Pertinent Negatives: Patient denies fever, redness, SOB/difficulty breathing Disposition: [] ED /[] Urgent Care (no appt availability in office) / [x] Appointment(In office/virtual)/ []  [facility]/ [] Home Care/ [] Refused Recommended Disposition /[] [facility]/ []  Follow-up with PCP Additional Notes: Pt family member calling on his behalf. Pt was recently to see the podiatrist and son noted that the pt had bilateral foot swelling. Pt not with caller at this time. Pt scheduled. Copied from CRM [id number]. Topic: Clinical - Red Word Triage >> Apr 11, 2024  1:55 PM Emylou G wrote: [facility] that prompted transfer to Nurse Triage: Feet is extremely swollen Reason for Disposition . [1] MODERATE leg swelling (e.g., swelling extends up to knees) AND [2] new-onset or worsening  Answer Assessment - Initial Assessment Questions 1. ONSET: "When did the swelling start?" (e.g., minutes, hours, days)     unsure 2. LOCATION: "What part of the leg is swollen?"  "Are both legs swollen or just one leg?"     bilateral 3. SEVERITY: "How bad is the swelling?" (e.g., localized; mild, moderate, severe)   - Localized: Small area of swelling localized to one leg.   - MILD pedal edema: Swelling limited to foot and ankle, pitting edema < 1/4 inch (6 mm) deep, rest and elevation eliminate most or all swelling.   - MODERATE edema: Swelling of lower leg to knee, pitting edema > 1/4 inch (6 mm) deep, rest and elevation only partially reduce swelling.   - SEVERE edema: Swelling extends above knee, facial or hand swelling present.      Moderate to severe 4. REDNESS: "Does the swelling look red or infected?"     denies 5. PAIN: "Is the swelling painful to touch?" If Yes, ask: "How painful is it?"   (Scale 1-10; mild, moderate or severe)     denies 6. FEVER: "Do you have a  fever?" If Yes, ask: "What is it, how was it measured, and when did it start?"      denies 7. CAUSE: "What do you think is causing the leg swelling?"     Pt does not walk alot 9. RECURRENT SYMPTOM: "Have you had leg swelling before?" If Yes, ask: "When was the last time?" "What happened that time?"     denies 10. OTHER SYMPTOMS: "Do you have any other symptoms?" (e.g., chest pain, difficulty breathing)       denies  Protocols used: Leg Swelling and Edema-A-AH

## 2024-04-11 NOTE — Telephone Encounter (Signed)
 Apt scheduled.

## 2024-04-12 ENCOUNTER — Encounter: Payer: Self-pay | Admitting: Family Medicine

## 2024-04-12 ENCOUNTER — Ambulatory Visit (INDEPENDENT_AMBULATORY_CARE_PROVIDER_SITE_OTHER)

## 2024-04-12 VITALS — BP 170/77 | HR 71 | Temp 97.5°F | Ht 72.0 in | Wt 216.8 lb

## 2024-04-12 DIAGNOSIS — I872 Venous insufficiency (chronic) (peripheral): Secondary | ICD-10-CM | POA: Diagnosis not present

## 2024-04-12 DIAGNOSIS — I10 Essential (primary) hypertension: Secondary | ICD-10-CM | POA: Diagnosis not present

## 2024-04-12 DIAGNOSIS — E559 Vitamin D deficiency, unspecified: Secondary | ICD-10-CM | POA: Diagnosis not present

## 2024-04-12 DIAGNOSIS — R600 Localized edema: Secondary | ICD-10-CM | POA: Diagnosis not present

## 2024-04-12 DIAGNOSIS — H9190 Unspecified hearing loss, unspecified ear: Secondary | ICD-10-CM

## 2024-04-12 DIAGNOSIS — R739 Hyperglycemia, unspecified: Secondary | ICD-10-CM | POA: Diagnosis not present

## 2024-04-12 NOTE — Progress Notes (Signed)
 Subjective:  Patient ID: Jonathan Sparks, male    DOB: 03-13-1941, 83 y.o.   MRN: [id number]  Patient Care Team: Yevette Hem, FNP as PCP - General (Family Medicine) Eldred Grego, AUD (Audiology) Ashok Blake, DPM as Consulting Physician (Podiatry)   Chief Complaint:  Foot Swelling (Both feet swollen for about 6 months, toenails are yellow, feet are twice normal size. Patients son states that the patient has stopped taking all medication.)  HPI: Jonathan Sparks is a 83 y.o. male presenting on 04/12/2024 for Foot Swelling (Both feet swollen for about 6 months, toenails are yellow, feet are twice normal size. Patients son states that the patient has stopped taking all medication.)  HPI 1. Peripheral vascular disease with stasis dermatitis States that it started about 6 months ago. He was going to get his toenails trimmed and realized it was worse. Reports that he has not been taking medications for 6 months. Patient lives with son. Second son is close by.  Denies confusion. Per patient "has over a year supply of medicine at home". Believes that he was taking lisinopril  10 mg 1-2 x per week. Not currently wearing compression stockings. Is ambulatory. Has trouble with ambulation due to balance impairment. In addition, he wonders if his vitamin D  is low again.     Relevant past medical, surgical, family, and social history reviewed and updated as indicated.  Allergies and medications reviewed and updated. Data reviewed: Chart in Epic.   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   Procedure: TOTAL HIP ARTHROPLASTY ANTERIOR APPROACH;  Surgeon: Saundra Curl, MD;  Location: [facility] OR;  Service: Orthopedics;  Laterality: Left;    Social History   Socioeconomic History    Marital status: Widowed    Spouse name: Not on file   Number of children: 2   Years of education: Not on file   Highest education level: Not on file  Occupational History   Occupation: retired   Tobacco Use   Smoking status: Every Day    Current packs/day: 0.50    Types: Cigarettes   Smokeless tobacco: Never  Vaping Use   Vaping status: Never Used  Substance and Sexual Activity   Alcohol use: No   Drug use: No   Sexual activity: Not on file  Other Topics Concern   Not on file  Social History Narrative   Lives in his home - 1 son lives with him    Social Drivers of Corporate investment banker Strain: Not on file  Food Insecurity: Not on file  Transportation Needs: Not on file  Physical Activity: Not on file  Stress: Not on file  Social Connections: Not on file  Intimate Partner Violence: Not on file    Outpatient Encounter Medications as of 04/12/2024  Medication Sig   atorvastatin  (LIPITOR) 40 MG tablet Take 1 tablet (40 mg total) by mouth daily. **NEEDS TO BE SEEN BEFORE NEXT REFILL**   busPIRone  (BUSPAR ) 5 MG tablet Take 1 tablet (5 mg total) by mouth 2 (two) times daily.   escitalopram  (LEXAPRO ) 20 MG tablet Take 1 tablet (20 mg total) by mouth daily.   lisinopril  (ZESTRIL ) 10 MG tablet Take 1 tablet (  10 mg total) by mouth daily. **NEEDS TO BE SEEN BEFORE NEXT REFILL**   senna-docusate (SENOKOT-S) 8.6-50 MG tablet Take 1 tablet by mouth at bedtime.   Vitamin D , Ergocalciferol , (DRISDOL ) 1.25 MG (50000 UNIT) CAPS capsule TAKE 1 CAPSULE EVERY 7 DAYS   No facility-administered encounter medications on file as of 04/12/2024.    No Known Allergies  Review of Systems As per HPI  Objective:  BP (!) 170/77   Pulse 71   Temp (!) 97.5 F (36.4 C)   Ht 6' (1.829 m)   Wt 216 lb 12.8 oz (98.3 kg)   SpO2 (!) 88%   BMI 29.40 kg/m    Wt Readings from Last 3 Encounters:  04/12/24 216 lb 12.8 oz (98.3 kg)  08/11/22 223 lb (101.2 kg)  08/01/21 213 lb (96.6 kg)    Physical Exam Constitutional:      General: He is awake. He is not in acute distress.    Appearance: Normal appearance. He is well-groomed and overweight. He is not ill-appearing, toxic-appearing or diaphoretic.  HENT:     Head:     Comments: Very hard of hearing  Cardiovascular:     Rate and Rhythm: Normal rate and regular rhythm.     Heart sounds: Normal heart sounds.  Pulmonary:     Effort: Pulmonary effort is normal.     Breath sounds: Normal breath sounds.  Musculoskeletal:     Right lower leg: 1+ Edema present.     Left lower leg: 1+ Edema present.  Feet:     Right foot:     Toenail Condition: Right toenails are abnormally thick and long. Fungal disease present.    Left foot:     Toenail Condition: Left toenails are abnormally thick and long. Fungal disease present. Skin:    Comments: Dark, reddish/brown color of bilateral lower extremities   Neurological:     General: No focal deficit present.     Mental Status: He is alert, oriented to person, place, and time and easily aroused. Mental status is at baseline.  Psychiatric:        Attention and Perception: Attention and perception normal.        Mood and Affect: Mood and affect normal.        Speech: Speech normal.        Behavior: Behavior normal. Behavior is cooperative.        Thought Content: Thought content normal.        Cognition and Memory: Cognition and memory normal.        Judgment: Judgment normal.     Results for orders placed or performed in visit on 08/11/22  CMP14+EGFR   Collection Time: 08/11/22  1:08 PM  Result Value Ref Range   Glucose 87 70 - 99 mg/dL   BUN 16 8 - 27 mg/dL   Creatinine, Ser 1.61 0.76 - 1.27 mg/dL   eGFR 71 >09 UE/AVW/0.98   BUN/Creatinine Ratio 15 10 - 24   Sodium 140 134 - 144 mmol/L   Potassium 4.9 3.5 - 5.2 mmol/L   Chloride 97 96 - 106 mmol/L   CO2 26 20 - 29 mmol/L   Calcium  9.3 8.6 - 10.2 mg/dL   Total Protein 7.1 6.0 - 8.5 g/dL   Albumin  4.5 3.8 - 4.8 g/dL    Globulin, Total 2.6 1.5 - 4.5 g/dL   Albumin /Globulin Ratio 1.7 1.2 - 2.2   Bilirubin Total 0.7 0.0 - 1.2 mg/dL   Alkaline Phosphatase 104 44 -  121 IU/L   AST 20 0 - 40 IU/L   ALT 24 0 - 44 IU/L  CBC with Differential/Platelet   Collection Time: 08/11/22  1:08 PM  Result Value Ref Range   WBC 9.6 3.4 - 10.8 x10E3/uL   RBC 5.79 4.14 - 5.80 x10E6/uL   Hemoglobin 18.2 (H) 13.0 - 17.7 g/dL   Hematocrit 16.1 (H) 09.6 - 51.0 %   MCV 93 79 - 97 fL   MCH 31.4 26.6 - 33.0 pg   MCHC 33.9 31.5 - 35.7 g/dL   RDW 04.5 40.9 - 81.1 %   Platelets 241 150 - 450 x10E3/uL   Neutrophils 67 Not Estab. %   Lymphs 20 Not Estab. %   Monocytes 9 Not Estab. %   Eos 2 Not Estab. %   Basos 1 Not Estab. %   Neutrophils Absolute 6.6 1.4 - 7.0 x10E3/uL   Lymphocytes Absolute 1.9 0.7 - 3.1 x10E3/uL   Monocytes Absolute 0.8 0.1 - 0.9 x10E3/uL   EOS (ABSOLUTE) 0.2 0.0 - 0.4 x10E3/uL   Basophils Absolute 0.1 0.0 - 0.2 x10E3/uL   Immature Granulocytes 1 Not Estab. %   Immature Grans (Abs) 0.1 0.0 - 0.1 x10E3/uL  Lipid panel   Collection Time: 08/11/22  1:08 PM  Result Value Ref Range   Cholesterol, Total 118 100 - 199 mg/dL   Triglycerides 914 0 - 149 mg/dL   HDL 37 (L) >78 mg/dL   VLDL Cholesterol Cal 22 5 - 40 mg/dL   LDL Chol Calc (NIH) 59 0 - 99 mg/dL   Chol/HDL Ratio 3.2 0.0 - 5.0 ratio  TSH   Collection Time: 08/11/22  1:08 PM  Result Value Ref Range   TSH 1.100 0.450 - 4.500 uIU/mL  VITAMIN D  25 Hydroxy (Vit-D Deficiency, Fractures)   Collection Time: 08/11/22  1:08 PM  Result Value Ref Range   Vit D, 25-Hydroxy 57.3 30.0 - 100.0 ng/mL       08/11/2022   12:29 PM 08/01/2021   11:21 AM 06/26/2021    3:34 PM 04/29/2020    2:09 PM 01/02/2020    3:54 PM  Depression screen PHQ 2/9  Decreased Interest 0 0 0 2 0  Down, Depressed, Hopeless 0 0 0 1 0  PHQ - 2 Score 0 0 0 3 0  Altered sleeping 0 0 0 0   Tired, decreased energy 0 0 0 1   Change in appetite 0 0 0 0   Feeling bad or failure about  yourself  0 0 0 2   Trouble concentrating 0 0 0 2   Moving slowly or fidgety/restless 0 0 0 1   Suicidal thoughts 0  0 0   PHQ-9 Score 0 0 0 9   Difficult doing work/chores Not difficult at all  Not difficult at all Somewhat difficult       08/11/2022   12:29 PM 08/01/2021   11:22 AM 06/26/2021    3:34 PM 12/18/2019   12:10 PM  GAD 7 : Generalized Anxiety Score  Nervous, Anxious, on Edge 0 0 0 0  Control/stop worrying 0 0 0 0  Worry too much - different things 0 0 0 0  Trouble relaxing 0 0 0 0  Restless 0 0 0 0  Easily annoyed or irritable  0 0 0  Afraid - awful might happen 0 0 0 0  Total GAD 7 Score  0 0 0  Anxiety Difficulty Not difficult at all  Not difficult at all  Pertinent labs & imaging results that were available during my care of the patient were reviewed by me and considered in my medical decision making.  Assessment & Plan:  Ferlando "Kermit Ped" was seen today for foot swelling.  Diagnoses and all orders for this visit:  1. Peripheral vascular disease with stasis dermatitis (Primary) Recommend ambulation as tolerated, compression stockings and elevation. Referral placed as below. Patient to follow up with PCP next week.  - Compression stockings - Ambulatory referral to Vascular Surgery  2. Bilateral lower extremity edema As above.  - CMP14+EGFR - CBC with Differential/Platelet - Compression stockings - Ambulatory referral to Vascular Surgery  3. Vitamin D  deficiency - VITAMIN D  25 Hydroxy (Vit-D Deficiency, Fractures)  4. Essential hypertension with goal blood pressure less than 130/80 Encouraged patient to restart lisinopril  daily. Discussed with son monitoring patient taking medications. Elevated BP today in office. Discussed with patient monitoring BP at home. Bring measurements for reviewed with PCP at follow up and determine plan for BP management.  - For home use only DME Other see comment  5. HOH (hard of hearing) Patient is orientedx 4. Difficult to  assess due to hearing.   Continue all other maintenance medications.  Follow up plan: Return in about 1 week (around 04/19/2024) for BP and feet follow up with PCP .   Continue healthy lifestyle choices, including diet (rich in fruits, vegetables, and lean proteins, and low in salt and simple carbohydrates) and exercise (at least 30 minutes of moderate physical activity daily).  Written and verbal instructions provided   The above assessment and management plan was discussed with the patient. The patient verbalized understanding of and has agreed to the management plan. Patient is aware to call the clinic if they develop any new symptoms or if symptoms persist or worsen. Patient is aware when to return to the clinic for a follow-up visit. Patient educated on when it is appropriate to go to the emergency department.   Jacqualyn Mates, DNP-FNP [facility] [address] [place], [place] [address] [phone]

## 2024-04-12 NOTE — Patient Instructions (Signed)

## 2024-04-13 ENCOUNTER — Ambulatory Visit: Payer: Self-pay | Admitting: Family Medicine

## 2024-04-13 LAB — CMP14+EGFR
ALT: 21 IU/L (ref 0–44)
AST: 20 IU/L (ref 0–40)
Albumin: 4.1 g/dL (ref 3.7–4.7)
Alkaline Phosphatase: 120 IU/L (ref 44–121)
BUN/Creatinine Ratio: 13 (ref 10–24)
BUN: 14 mg/dL (ref 8–27)
Bilirubin Total: 0.6 mg/dL (ref 0.0–1.2)
CO2: 25 mmol/L (ref 20–29)
Calcium: 9 mg/dL (ref 8.6–10.2)
Chloride: 101 mmol/L (ref 96–106)
Creatinine, Ser: 1.07 mg/dL (ref 0.76–1.27)
Globulin, Total: 2.7 g/dL (ref 1.5–4.5)
Glucose: 126 mg/dL — ABNORMAL HIGH (ref 70–99)
Potassium: 4.8 mmol/L (ref 3.5–5.2)
Sodium: 144 mmol/L (ref 134–144)
Total Protein: 6.8 g/dL (ref 6.0–8.5)
eGFR: 70 mL/min/{1.73_m2} (ref >59)

## 2024-04-13 LAB — CBC WITH DIFFERENTIAL/PLATELET
Basophils Absolute: 0.1 10*3/uL (ref 0.0–0.2)
Basos: 1 %
EOS (ABSOLUTE): 0.2 10*3/uL (ref 0.0–0.4)
Eos: 2 %
Hematocrit: 51.6 % — ABNORMAL HIGH (ref 37.5–51.0)
Hemoglobin: 17 g/dL (ref 13.0–17.7)
Immature Grans (Abs): 0 10*3/uL (ref 0.0–0.1)
Immature Granulocytes: 0 %
Lymphocytes Absolute: 1.6 10*3/uL (ref 0.7–3.1)
Lymphs: 18 %
MCH: 31.8 pg (ref 26.6–33.0)
MCHC: 32.9 g/dL (ref 31.5–35.7)
MCV: 97 fL (ref 79–97)
Monocytes Absolute: 0.6 10*3/uL (ref 0.1–0.9)
Monocytes: 7 %
Neutrophils Absolute: 6.3 10*3/uL (ref 1.4–7.0)
Neutrophils: 72 %
Platelets: 263 10*3/uL (ref 150–450)
RBC: 5.34 x10E6/uL (ref 4.14–5.80)
RDW: 13.2 % (ref 11.6–15.4)
WBC: 8.8 10*3/uL (ref 3.4–10.8)

## 2024-04-13 LAB — VITAMIN D 25 HYDROXY (VIT D DEFICIENCY, FRACTURES): Vit D, 25-Hydroxy: 36.1 ng/mL (ref 30.0–100.0)

## 2024-04-13 NOTE — Progress Notes (Signed)
 Can we add on A1C?  Slightly elevated hct, stable, can correlate with tobacco use. Recommend follow up with PCP.

## 2024-04-14 LAB — HGB A1C W/O EAG: Hgb A1c MFr Bld: 6.3 % — ABNORMAL HIGH (ref 4.8–5.6)

## 2024-04-14 LAB — SPECIMEN STATUS REPORT

## 2024-04-17 NOTE — Progress Notes (Signed)
 Prediabetes based on A1C, recommend follow up with PCP.

## 2024-04-18 ENCOUNTER — Ambulatory Visit: Admitting: Family

## 2024-04-19 ENCOUNTER — Encounter: Payer: Self-pay | Admitting: Family

## 2024-04-24 ENCOUNTER — Encounter: Payer: Self-pay | Admitting: Family

## 2024-04-24 ENCOUNTER — Ambulatory Visit: Admitting: Family

## 2024-04-24 VITALS — BP 137/86 | HR 73 | Temp 98.0°F | Ht 72.0 in | Wt 213.0 lb

## 2024-04-24 DIAGNOSIS — I739 Peripheral vascular disease, unspecified: Secondary | ICD-10-CM

## 2024-04-24 DIAGNOSIS — H9190 Unspecified hearing loss, unspecified ear: Secondary | ICD-10-CM

## 2024-04-24 DIAGNOSIS — Z0001 Encounter for general adult medical examination with abnormal findings: Secondary | ICD-10-CM

## 2024-04-24 DIAGNOSIS — K5900 Constipation, unspecified: Secondary | ICD-10-CM

## 2024-04-24 DIAGNOSIS — Z0000 Encounter for general adult medical examination without abnormal findings: Secondary | ICD-10-CM

## 2024-04-24 DIAGNOSIS — F17200 Nicotine dependence, unspecified, uncomplicated: Secondary | ICD-10-CM | POA: Diagnosis not present

## 2024-04-24 DIAGNOSIS — H6123 Impacted cerumen, bilateral: Secondary | ICD-10-CM

## 2024-04-24 DIAGNOSIS — E782 Mixed hyperlipidemia: Secondary | ICD-10-CM

## 2024-04-24 DIAGNOSIS — R7303 Prediabetes: Secondary | ICD-10-CM | POA: Diagnosis not present

## 2024-04-24 DIAGNOSIS — I10 Essential (primary) hypertension: Secondary | ICD-10-CM | POA: Diagnosis not present

## 2024-04-24 NOTE — Patient Instructions (Signed)
 Prediabetes: What to Know Prediabetes is when your blood sugar, also called glucose, is at a higher level than normal but not high enough for you to be diagnosed with type 2 diabetes (type 2 diabetes mellitus). Having prediabetes puts you at risk for getting type 2 diabetes. By making some healthy changes, you may be able to prevent or delay getting type 2 diabetes. This is important because type 2 diabetes can lead to serious problems. Some of these include: Heart disease. Stroke. Blindness. Kidney disease. Depression. Poor blood flow in the feet and legs. In very bad cases, this could lead to having a leg removed by surgery (amputation). What are the causes? The exact cause of prediabetes isn't known. It may result from insulin resistance. Insulin resistance happens when cells in the body don't respond properly to insulin that the body makes. This can cause too much sugar to build up in the blood. High blood sugar, also called hyperglycemia, can develop. What increases the risk? Having a family member with type 2 diabetes. Being older than 81 years of age. Having had a temporary form of diabetes during a pregnancy. This is called gestational diabetes. Having had polycystic ovary syndrome (PCOS). Being overweight or obese. Being inactive and not getting much exercise. Having a history of heart disease. This may include problems with cholesterol levels, high levels of blood fats, or high blood pressure. What are the signs or symptoms? You may have no symptoms. If you do have symptoms, they may include: Increased hunger. Increased thirst. Needing to pee more often. Changes in how you see, like blurry vision. Feeling tired. How is this diagnosed? Prediabetes can be diagnosed with blood tests that check your blood sugar. One or more of these tests may be done: A fasting blood glucose (FBG) test. You won't be allowed to eat (you will fast) for at least 8 hours before a blood sample is  taken. An A1C blood test, also called a hemoglobin A1C test. This test shows information about blood sugar levels over the past 2?3 months. An oral glucose tolerance test (OGTT). This test measures your blood sugar at two points in time: After you haven't eaten for a while. This is your baseline level. Two hours after you drink a beverage that has sugar in it. You may be diagnosed with prediabetes if: Your FBG is 100?125 mg/dL (1.6-1.0 mmol/L). Your A1C level is 5.7?6.4% (39-46 mmol/mol). Your OGTT result is 140?199 mg/dL (9.6-04 mmol/L). These blood tests may need to be done again to be sure of the diagnosis. How is this treated? Treatment may include making changes to your diet and lifestyle. These changes can help lower your blood sugar and keep you from getting type 2 diabetes. In some cases, medicine may be given to help lower your risk. Follow these instructions at home: Eating and drinking  Eat and drink as told. Follow a healthy meal plan. This includes eating lean proteins, whole grains, legumes, fresh fruits and vegetables, low-fat dairy products, and healthy fats. Meet with an expert in healthy eating called a dietitian. This person can help create a healthy eating plan that's right for you. Lifestyle Do moderate-intensity exercise. Do this for at least 30 minutes a day on 5 or more days each week, or as told by your health care provider. A mix of activities may be best. Good choices include brisk walking, swimming, biking, and weight lifting. Try to lose weight if your provider says it's OK. Losing 5-7% of your body weight can  help reverse insulin resistance. Do not drink alcohol if: Your provider tells you not to drink. You're pregnant, may be pregnant, or plan to become pregnant. If you drink alcohol: Limit how much you have to: 0-1 drink a day if you're male. 0-2 drinks a day if you're male. Know how much alcohol is in your drink. In the [place] one drink is one 12 oz  bottle of beer (355 mL), one 5 oz glass of wine (148 mL), or one 1 oz glass of hard liquor (44 mL). General instructions Take medicines only as told. You may be given medicines that help lower the risk of type 2 diabetes. Do not smoke, vape, or use nicotine or tobacco. Where to find more information American Diabetes Association: diabetes.org/about-diabetes/prediabetes Academy of Nutrition and Dietetics: eatright.org American Heart Association: Go to ThisJobs.cz. Click the search icon. Type "prediabetes" in the search box. Contact a health care provider if: You have any of these symptoms: Increased hunger. Peeing more often than usual. Increased thirst. Feeling tired. Changes in how you see, like blurry vision. Feeling like you may throw up. Throwing up. Get help right away if: You have shortness of breath. You feel confused. This information is not intended to replace advice given to you by your health care provider. Make sure you discuss any questions you have with your health care provider. Document Revised: 06/06/2023 Document Reviewed: 06/06/2023 Elsevier Patient Education  2024 [employer].

## 2024-04-24 NOTE — Progress Notes (Signed)
 Subjective:    Patient ID: Jonathan Sparks, male    DOB: [birth year], 83 y.o.   MRN: [id number]  Chief Complaint  Patient presents with   Medical Management of Chronic Issues    Follow up on legs    PT presents to the office today for CPE .   Pt has prediabetes.  Hypertension This is a chronic problem. The current episode started more than 1 year ago. The problem has been resolved since onset. The problem is controlled. Associated symptoms include peripheral edema and shortness of breath. Pertinent negatives include no malaise/fatigue. Risk factors for coronary artery disease include male gender and smoking/tobacco exposure. The current treatment provides moderate improvement.  Hyperlipidemia This is a chronic problem. The current episode started more than 1 year ago. The problem is controlled. Recent lipid tests were reviewed and are normal. Exacerbating diseases include obesity. Associated symptoms include shortness of breath. Current antihyperlipidemic treatment includes statins. The current treatment provides moderate improvement of lipids. Risk factors for coronary artery disease include hypertension, a sedentary lifestyle, dyslipidemia, obesity and male sex.  Constipation This is a chronic problem. The current episode started more than 1 year ago. The problem has been resolved since onset. His stool frequency is 1 time per day. He has tried laxatives for the symptoms. The treatment provided moderate relief.  Nicotine Dependence Presents for follow-up visit. His urge triggers include company of smokers. The symptoms have been stable. He smokes 1 pack of cigarettes per day.      Review of Systems  Constitutional:  Negative for malaise/fatigue.  Respiratory:  Positive for shortness of breath.   Gastrointestinal:  Positive for constipation.    Social History   Socioeconomic History   Marital status: Widowed    Spouse name: Not on file   Number of children: 2   Years of  education: Not on file   Highest education level: Not on file  Occupational History   Occupation: retired   Tobacco Use   Smoking status: Every Day    Current packs/day: 0.50    Types: Cigarettes   Smokeless tobacco: Never  Vaping Use   Vaping status: Never Used  Substance and Sexual Activity   Alcohol use: No   Drug use: No   Sexual activity: Not on file  Other Topics Concern   Not on file  Social History Narrative   Lives in his home - 1 son lives with him    Social Drivers of Corporate investment banker Strain: Not on file  Food Insecurity: Not on file  Transportation Needs: Not on file  Physical Activity: Not on file  Stress: Not on file  Social Connections: Not on file   Family History  Problem Relation Age of Onset   Alcohol abuse Father    Heart attack Brother    Hyperlipidemia Son    Hypertension Son         Objective:   Physical Exam Vitals reviewed.  Constitutional:      General: He is not in acute distress.    Appearance: He is well-developed.  HENT:     Head: Normocephalic.     Right Ear: There is impacted cerumen.     Left Ear: There is impacted cerumen.     Ears:     Comments: Cerumen impaction   Eyes:     General:        Right eye: No discharge.        Left eye: No discharge.  Pupils: Pupils are equal, round, and reactive to light.  Neck:     Thyroid : No thyromegaly.  Cardiovascular:     Rate and Rhythm: Normal rate and regular rhythm.     Heart sounds: Normal heart sounds. No murmur heard. Pulmonary:     Effort: Pulmonary effort is normal. No respiratory distress.     Breath sounds: Normal breath sounds. No wheezing.  Abdominal:     General: Bowel sounds are normal. There is no distension.     Palpations: Abdomen is soft.     Tenderness: There is no abdominal tenderness.  Musculoskeletal:        General: No tenderness. Normal range of motion.     Cervical back: Normal range of motion and neck supple.  Skin:    General: Skin is  warm and dry.     Findings: No erythema or rash.     Comments: Discoloration of lower bilateral legs  Neurological:     Mental Status: He is alert and oriented to person, place, and time.     Cranial Nerves: No cranial nerve deficit.     Deep Tendon Reflexes: Reflexes are normal and symmetric.  Psychiatric:        Behavior: Behavior normal.        Thought Content: Thought content normal.        Judgment: Judgment normal.    Bilateral ears washed with warm water and peroxide, pt tolerated well.    BP 137/86   Pulse 73   Temp 98 F (36.7 C) (Temporal)   Ht 6' (1.829 m)   Wt 213 lb (96.6 kg)   BMI 28.89 kg/m      Assessment & Plan:  Jonathan Sparks comes in today with chief complaint of Medical Management of Chronic Issues (Follow up on legs )   Diagnosis and orders addressed:  1. Essential hypertension with goal blood pressure less than 130/80 At goal  2. Mixed hyperlipidemia Will check on next visit  3. Constipation, unspecified constipation type Continue senna   4. Current smoker Smoking cessation discussed, not interested at this time.   5. HOH (hard of hearing)   6. Annual physical exam (Primary)  7. Bilateral impacted cerumen Bilateral ears washed   8. PVD (peripheral vascular disease) (HCC) Continue to wear compression hose  Elevated feet Smoking cessation     9. Prediabetes Discussed low carb diet, given age will not be too strict on this.    Labs reviewed from 04/12/24 Continue current medications  Health Maintenance reviewed Diet and exercise encouraged  Return in about 6 months (around 10/24/2024), or if symptoms worsen or fail to improve.    Tommas Fragmin, FNP

## 2024-06-22 DIAGNOSIS — L84 Corns and callosities: Secondary | ICD-10-CM | POA: Diagnosis not present

## 2024-06-22 DIAGNOSIS — I70203 Unspecified atherosclerosis of native arteries of extremities, bilateral legs: Secondary | ICD-10-CM | POA: Diagnosis not present

## 2024-06-22 DIAGNOSIS — M79674 Pain in right toe(s): Secondary | ICD-10-CM | POA: Diagnosis not present

## 2024-06-22 DIAGNOSIS — M79675 Pain in left toe(s): Secondary | ICD-10-CM | POA: Diagnosis not present

## 2024-06-22 DIAGNOSIS — B351 Tinea unguium: Secondary | ICD-10-CM | POA: Diagnosis not present

## 2024-07-31 ENCOUNTER — Other Ambulatory Visit: Payer: Self-pay

## 2024-07-31 DIAGNOSIS — I739 Peripheral vascular disease, unspecified: Secondary | ICD-10-CM

## 2024-08-10 ENCOUNTER — Encounter

## 2024-08-10 ENCOUNTER — Encounter (HOSPITAL_COMMUNITY)

## 2024-08-29 ENCOUNTER — Ambulatory Visit (HOSPITAL_COMMUNITY): Admission: RE | Admit: 2024-08-29

## 2024-08-29 ENCOUNTER — Ambulatory Visit: Attending: Family Medicine

## 2024-09-12 NOTE — Progress Notes (Signed)
 Jonathan Sparks                                          MRN: [id number]   09/12/2024   The [facility] Quality Team Specialist reviewed this patient medical record for the purposes of chart review for care gap closure. The following were reviewed: chart review for care gap closure-kidney health evaluation for diabetes:eGFR  and uACR.    [facility] Quality Team

## 2024-10-24 ENCOUNTER — Encounter: Payer: Self-pay | Admitting: Family

## 2024-10-24 ENCOUNTER — Ambulatory Visit: Payer: Self-pay | Admitting: Family

## 2024-10-24 VITALS — BP 156/83 | HR 71 | Temp 97.8°F | Ht 72.0 in | Wt 213.4 lb

## 2024-10-24 DIAGNOSIS — Z23 Encounter for immunization: Secondary | ICD-10-CM

## 2024-10-24 DIAGNOSIS — K5900 Constipation, unspecified: Secondary | ICD-10-CM

## 2024-10-24 DIAGNOSIS — F411 Generalized anxiety disorder: Secondary | ICD-10-CM

## 2024-10-24 DIAGNOSIS — H9190 Unspecified hearing loss, unspecified ear: Secondary | ICD-10-CM

## 2024-10-24 DIAGNOSIS — R7303 Prediabetes: Secondary | ICD-10-CM

## 2024-10-24 DIAGNOSIS — E559 Vitamin D deficiency, unspecified: Secondary | ICD-10-CM

## 2024-10-24 DIAGNOSIS — E782 Mixed hyperlipidemia: Secondary | ICD-10-CM

## 2024-10-24 DIAGNOSIS — F17200 Nicotine dependence, unspecified, uncomplicated: Secondary | ICD-10-CM

## 2024-10-24 DIAGNOSIS — I10 Essential (primary) hypertension: Secondary | ICD-10-CM

## 2024-10-24 LAB — LIPID PANEL

## 2024-10-24 LAB — BAYER DCA HB A1C WAIVED: HB A1C (BAYER DCA - WAIVED): 5.8 % — ABNORMAL HIGH (ref 4.8–5.6)

## 2024-10-24 MED ORDER — ATORVASTATIN CALCIUM 40 MG PO TABS: 40.0000 mg | ORAL_TABLET | Freq: Every day | ORAL | 2 refills | Status: AC

## 2024-10-24 MED ORDER — LISINOPRIL 10 MG PO TABS: 10.0000 mg | ORAL_TABLET | Freq: Every day | ORAL | 2 refills | Status: AC

## 2024-10-24 NOTE — Progress Notes (Signed)
 Subjective:    Patient ID: Jonathan Sparks, male    DOB: [birth year], 83 y.o.   MRN: [id number]  Chief Complaint  Patient presents with   Medical Management of Chronic Issues   PT presents to the office today for chronic follow up.   Pt has prediabetes.  Hypertension This is a chronic problem. The current episode started more than 1 year ago. The problem has been waxing and waning since onset. The problem is uncontrolled. Associated symptoms include shortness of breath. Pertinent negatives include no malaise/fatigue or peripheral edema. Risk factors for coronary artery disease include male gender and smoking/tobacco exposure. The current treatment provides moderate improvement.  Hyperlipidemia This is a chronic problem. The current episode started more than 1 year ago. The problem is controlled. Recent lipid tests were reviewed and are normal. Exacerbating diseases include obesity. Associated symptoms include shortness of breath. Current antihyperlipidemic treatment includes statins. The current treatment provides moderate improvement of lipids. Risk factors for coronary artery disease include hypertension, a sedentary lifestyle, dyslipidemia, obesity and male sex.  Constipation This is a chronic problem. The current episode started more than 1 year ago. The problem has been resolved since onset. His stool frequency is 1 time per day. He has tried laxatives for the symptoms. The treatment provided moderate relief.  Nicotine Dependence Presents for follow-up visit. The symptoms have been stable. He smokes 1 pack of cigarettes per day.      Review of Systems  Constitutional:  Negative for malaise/fatigue.  Respiratory:  Positive for shortness of breath.   Gastrointestinal:  Positive for constipation.  All other systems reviewed and are negative.   Social History   Socioeconomic History   Marital status: Widowed    Spouse name: Not on file   Number of children: 2   Years of  education: Not on file   Highest education level: Not on file  Occupational History   Occupation: retired   Tobacco Use   Smoking status: Every Day    Current packs/day: 0.50    Types: Cigarettes   Smokeless tobacco: Never  Vaping Use   Vaping status: Never Used  Substance and Sexual Activity   Alcohol use: No   Drug use: No   Sexual activity: Not on file  Other Topics Concern   Not on file  Social History Narrative   Lives in his home - 1 son lives with him    Social Drivers of Corporate Investment Banker Strain: Not on file  Food Insecurity: Not on file  Transportation Needs: Not on file  Physical Activity: Not on file  Stress: Not on file  Social Connections: Not on file   Family History  Problem Relation Age of Onset   Alcohol abuse Father    Heart attack Brother    Hyperlipidemia Son    Hypertension Son         Objective:   Physical Exam Vitals reviewed.  Constitutional:      General: He is not in acute distress.    Appearance: He is well-developed.  HENT:     Head: Normocephalic.     Right Ear: There is impacted cerumen.     Left Ear: Tympanic membrane normal.     Ears:     Comments: Cerumen impaction   Eyes:     General:        Right eye: No discharge.        Left eye: No discharge.     Pupils: Pupils  are equal, round, and reactive to light.  Neck:     Thyroid : No thyromegaly.  Cardiovascular:     Rate and Rhythm: Normal rate and regular rhythm.     Heart sounds: Normal heart sounds. No murmur heard. Pulmonary:     Effort: Pulmonary effort is normal. No respiratory distress.     Breath sounds: Normal breath sounds. No wheezing.  Abdominal:     General: Bowel sounds are normal. There is no distension.     Palpations: Abdomen is soft.     Tenderness: There is no abdominal tenderness.  Musculoskeletal:        General: No tenderness. Normal range of motion.     Cervical back: Normal range of motion and neck supple.  Skin:    General: Skin is  warm and dry.     Findings: No erythema or rash.     Comments: Discoloration of lower bilateral legs  Neurological:     Mental Status: He is alert and oriented to person, place, and time.     Cranial Nerves: No cranial nerve deficit.     Deep Tendon Reflexes: Reflexes are normal and symmetric.  Psychiatric:        Behavior: Behavior normal.        Thought Content: Thought content normal.        Judgment: Judgment normal.    Bilateral ears washed with warm water and peroxide, pt tolerated well.    BP (!) 156/83   Pulse 71   Temp 97.8 F (36.6 C) (Temporal)   Ht 6' (1.829 m)   Wt 213 lb 6.4 oz (96.8 kg)   SpO2 90%   BMI 28.94 kg/m      Assessment & Plan:  Chet Greenley Everingham comes in today with chief complaint of Medical Management of Chronic Issues   Diagnosis and orders addressed: 1. Mixed hyperlipidemia - atorvastatin  (LIPITOR) 40 MG tablet; Take 1 tablet (40 mg total) by mouth daily.  Dispense: 90 tablet; Refill: 2 - CMP14+EGFR - CBC with Differential/Platelet - Lipid panel  2. Essential hypertension with goal blood pressure less than 130/80 (Primary)  - lisinopril  (ZESTRIL ) 10 MG tablet; Take 1 tablet (10 mg total) by mouth daily. **NEEDS TO BE SEEN BEFORE NEXT REFILL**  Dispense: 90 tablet; Refill: 2 - CMP14+EGFR - CBC with Differential/Platelet  3. Encounter for immunization - Flu vaccine HIGH DOSE PF(Fluzone Trivalent)  4. Anxiety, generalized - CMP14+EGFR - CBC with Differential/Platelet  5. Constipation, unspecified constipation type - CMP14+EGFR - CBC with Differential/Platelet  6. Current smoker - CMP14+EGFR - CBC with Differential/Platelet  7. HOH (hard of hearing) - CMP14+EGFR - CBC with Differential/Platelet - Ambulatory referral to ENT  8. Prediabetes - Bayer DCA Hb A1c Waived - CMP14+EGFR - CBC with Differential/Platelet  9. Vitamin D  deficiency - CMP14+EGFR - CBC with Differential/Platelet    Labs pending Handicap form  completed Continue current medications  Health Maintenance reviewed Diet and exercise encouraged  Return in about 6 months (around 04/24/2025), or if symptoms worsen or fail to improve.    Bari Learn, FNP

## 2024-10-24 NOTE — Patient Instructions (Signed)
 Hypertension, Adult High blood pressure (hypertension) is when the force of blood pumping through the arteries is too strong. The arteries are the blood vessels that carry blood from the heart throughout the body. Hypertension forces the heart to work harder to pump blood and may cause arteries to become narrow or stiff. Untreated or uncontrolled hypertension can lead to a heart attack, heart failure, a stroke, kidney disease, and other problems. A blood pressure reading consists of a higher number over a lower number. Ideally, your blood pressure should be below 120/80. The first ("top") number is called the systolic pressure. It is a measure of the pressure in your arteries as your heart beats. The second ("bottom") number is called the diastolic pressure. It is a measure of the pressure in your arteries as the heart relaxes. What are the causes? The exact cause of this condition is not known. There are some conditions that result in high blood pressure. What increases the risk? Certain factors may make you more likely to develop high blood pressure. Some of these risk factors are under your control, including: Smoking. Not getting enough exercise or physical activity. Being overweight. Having too much fat, sugar, calories, or salt (sodium) in your diet. Drinking too much alcohol. Other risk factors include: Having a personal history of heart disease, diabetes, high cholesterol, or kidney disease. Stress. Having a family history of high blood pressure and high cholesterol. Having obstructive sleep apnea. Age. The risk increases with age. What are the signs or symptoms? High blood pressure may not cause symptoms. Very high blood pressure (hypertensive crisis) may cause: Headache. Fast or irregular heartbeats (palpitations). Shortness of breath. Nosebleed. Nausea and vomiting. Vision changes. Severe chest pain, dizziness, and seizures. How is this diagnosed? This condition is diagnosed by  measuring your blood pressure while you are seated, with your arm resting on a flat surface, your legs uncrossed, and your feet flat on the floor. The cuff of the blood pressure monitor will be placed directly against the skin of your upper arm at the level of your heart. Blood pressure should be measured at least twice using the same arm. Certain conditions can cause a difference in blood pressure between your right and left arms. If you have a high blood pressure reading during one visit or you have normal blood pressure with other risk factors, you may be asked to: Return on a different day to have your blood pressure checked again. Monitor your blood pressure at home for 1 week or longer. If you are diagnosed with hypertension, you may have other blood or imaging tests to help your health care provider understand your overall risk for other conditions. How is this treated? This condition is treated by making healthy lifestyle changes, such as eating healthy foods, exercising more, and reducing your alcohol intake. You may be referred for counseling on a healthy diet and physical activity. Your health care provider may prescribe medicine if lifestyle changes are not enough to get your blood pressure under control and if: Your systolic blood pressure is above 130. Your diastolic blood pressure is above 80. Your personal target blood pressure may vary depending on your medical conditions, your age, and other factors. Follow these instructions at home: Eating and drinking  Eat a diet that is high in fiber and potassium, and low in sodium, added sugar, and fat. An example of this eating plan is called the DASH diet. DASH stands for Dietary Approaches to Stop Hypertension. To eat this way: Eat  plenty of fresh fruits and vegetables. Try to fill one half of your plate at each meal with fruits and vegetables. Eat whole grains, such as whole-wheat pasta, brown rice, or whole-grain bread. Fill about one  fourth of your plate with whole grains. Eat or drink low-fat dairy products, such as skim milk or low-fat yogurt. Avoid fatty cuts of meat, processed or cured meats, and poultry with skin. Fill about one fourth of your plate with lean proteins, such as fish, chicken without skin, beans, eggs, or tofu. Avoid pre-made and processed foods. These tend to be higher in sodium, added sugar, and fat. Reduce your daily sodium intake. Many people with hypertension should eat less than 1,500 mg of sodium a day. Do not drink alcohol if: Your health care provider tells you not to drink. You are pregnant, may be pregnant, or are planning to become pregnant. If you drink alcohol: Limit how much you have to: 0-1 drink a day for women. 0-2 drinks a day for men. Know how much alcohol is in your drink. In the [place] one drink equals one 12 oz bottle of beer (355 mL), one 5 oz glass of wine (148 mL), or one 1 oz glass of hard liquor (44 mL). Lifestyle  Work with your health care provider to maintain a healthy body weight or to lose weight. Ask what an ideal weight is for you. Get at least 30 minutes of exercise that causes your heart to beat faster (aerobic exercise) most days of the week. Activities may include walking, swimming, or biking. Include exercise to strengthen your muscles (resistance exercise), such as Pilates or lifting weights, as part of your weekly exercise routine. Try to do these types of exercises for 30 minutes at least 3 days a week. Do not use any products that contain nicotine or tobacco. These products include cigarettes, chewing tobacco, and vaping devices, such as e-cigarettes. If you need help quitting, ask your health care provider. Monitor your blood pressure at home as told by your health care provider. Keep all follow-up visits. This is important. Medicines Take over-the-counter and prescription medicines only as told by your health care provider. Follow directions carefully. Blood  pressure medicines must be taken as prescribed. Do not skip doses of blood pressure medicine. Doing this puts you at risk for problems and can make the medicine less effective. Ask your health care provider about side effects or reactions to medicines that you should watch for. Contact a health care provider if you: Think you are having a reaction to a medicine you are taking. Have headaches that keep coming back (recurring). Feel dizzy. Have swelling in your ankles. Have trouble with your vision. Get help right away if you: Develop a severe headache or confusion. Have unusual weakness or numbness. Feel faint. Have severe pain in your chest or abdomen. Vomit repeatedly. Have trouble breathing. These symptoms may be an emergency. Get help right away. Call 911. Do not wait to see if the symptoms will go away. Do not drive yourself to the hospital. Summary Hypertension is when the force of blood pumping through your arteries is too strong. If this condition is not controlled, it may put you at risk for serious complications. Your personal target blood pressure may vary depending on your medical conditions, your age, and other factors. For most people, a normal blood pressure is less than 120/80. Hypertension is treated with lifestyle changes, medicines, or a combination of both. Lifestyle changes include losing weight, eating a healthy,  low-sodium diet, exercising more, and limiting alcohol. This information is not intended to replace advice given to you by your health care provider. Make sure you discuss any questions you have with your health care provider. Document Revised: 09/09/2021 Document Reviewed: 09/09/2021 Elsevier Patient Education  2024 [employer].

## 2024-10-25 LAB — CBC WITH DIFFERENTIAL/PLATELET
Basophils Absolute: 0.1 x10E3/uL (ref 0.0–0.2)
Basos: 1 %
EOS (ABSOLUTE): 0.2 x10E3/uL (ref 0.0–0.4)
Eos: 2 %
Hematocrit: 54.5 % — ABNORMAL HIGH (ref 37.5–51.0)
Hemoglobin: 17.9 g/dL — ABNORMAL HIGH (ref 13.0–17.7)
Immature Grans (Abs): 0.1 x10E3/uL (ref 0.0–0.1)
Immature Granulocytes: 1 %
Lymphocytes Absolute: 1.6 x10E3/uL (ref 0.7–3.1)
Lymphs: 18 %
MCH: 31.2 pg (ref 26.6–33.0)
MCHC: 32.8 g/dL (ref 31.5–35.7)
MCV: 95 fL (ref 79–97)
Monocytes Absolute: 0.7 x10E3/uL (ref 0.1–0.9)
Monocytes: 8 %
Neutrophils Absolute: 6.3 x10E3/uL (ref 1.4–7.0)
Neutrophils: 70 %
Platelets: 251 x10E3/uL (ref 150–450)
RBC: 5.74 x10E6/uL (ref 4.14–5.80)
RDW: 12.7 % (ref 11.6–15.4)
WBC: 9 x10E3/uL (ref 3.4–10.8)

## 2024-10-25 LAB — CMP14+EGFR
ALT: 15 IU/L (ref 0–44)
AST: 16 IU/L (ref 0–40)
Albumin: 4.2 g/dL (ref 3.7–4.7)
Alkaline Phosphatase: 110 IU/L (ref 48–129)
BUN/Creatinine Ratio: 14 (ref 10–24)
BUN: 16 mg/dL (ref 8–27)
Bilirubin Total: 0.5 mg/dL (ref 0.0–1.2)
CO2: 28 mmol/L (ref 20–29)
Calcium: 8.7 mg/dL (ref 8.6–10.2)
Chloride: 98 mmol/L (ref 96–106)
Creatinine, Ser: 1.11 mg/dL (ref 0.76–1.27)
Globulin, Total: 2.9 g/dL (ref 1.5–4.5)
Glucose: 87 mg/dL (ref 70–99)
Potassium: 4.7 mmol/L (ref 3.5–5.2)
Sodium: 140 mmol/L (ref 134–144)
Total Protein: 7.1 g/dL (ref 6.0–8.5)
eGFR: 67 mL/min/1.73 (ref >59)

## 2024-10-25 LAB — LIPID PANEL
Cholesterol, Total: 145 mg/dL (ref 100–199)
HDL: 39 mg/dL — AB (ref >39)
LDL CALC COMMENT:: 3.7 ratio (ref 0.0–5.0)
LDL Chol Calc (NIH): 84 mg/dL (ref 0–99)
Triglycerides: 125 mg/dL (ref 0–149)
VLDL Cholesterol Cal: 22 mg/dL (ref 5–40)

## 2024-10-26 ENCOUNTER — Ambulatory Visit: Payer: Self-pay | Admitting: Family

## 2024-11-17 ENCOUNTER — Encounter (INDEPENDENT_AMBULATORY_CARE_PROVIDER_SITE_OTHER): Payer: Self-pay | Admitting: Physician Assistant

## 2024-11-17 ENCOUNTER — Ambulatory Visit (INDEPENDENT_AMBULATORY_CARE_PROVIDER_SITE_OTHER): Admitting: Physician Assistant

## 2024-11-17 VITALS — BP 174/84 | HR 77 | Ht 72.0 in | Wt 215.0 lb

## 2024-11-17 DIAGNOSIS — H903 Sensorineural hearing loss, bilateral: Secondary | ICD-10-CM

## 2024-11-17 DIAGNOSIS — Z9621 Cochlear implant status: Secondary | ICD-10-CM | POA: Diagnosis not present

## 2024-11-17 NOTE — Progress Notes (Signed)
 Dear Dr. Lavell, Here is my assessment for our mutual patient, Jonathan Sparks. Thank you for allowing me the opportunity to care for your patient. Please do not hesitate to contact me should you have any other questions. Sincerely, Chyrl Cohen PA-C  Otolaryngology Clinic Note Referring provider: Dr. Lavell HPI:  Jonathan Sparks is a 84 y.o. male kindly referred by Dr. Lavell   Discussed the use of AI scribe software for clinical note transcription with the patient, who gave verbal consent to proceed.  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  He has had a cochlear implant since 2008, placed by Dr. Sherill. His original processor malfunctioned approximately two months ago, and he has been using a loaner device provided by [employer] during the repair process. The loaner processor does not provide meaningful improvement in hearing, and he relies on visual cues such as lip reading for communication.  He is seeking assistance with obtaining a new cochlear implant processor, as [employer] recommended referral to an ear specialist to facilitate insurance coverage for a replacement. The cost of a new processor is approximately $1,600. There have been ongoing delays and confusion in communication between [employer], his primary care provider, and other providers regarding the referral process.  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 He is unable to hear out of the right ear following the procedure. He reports that he avoids leaving the house due to communication challenges.        Independent Review of Additional Tests or Records:  none   PMH/Meds/All/SocHx/FamHx/ROS:   Past Medical History:  Diagnosis Date   Cancer (HCC)    prostate cancer     Hyperlipidemia    Hypertension      Past Surgical History:  Procedure Laterality Date   INNER EAR SURGERY     prostate cancer seed     TOTAL HIP ARTHROPLASTY Left 01/18/2020   Procedure: TOTAL HIP ARTHROPLASTY ANTERIOR APPROACH;  Surgeon: Beverley Evalene BIRCH, MD;  Location: [facility] OR;  Service: Orthopedics;  Laterality: Left;    Family History  Problem Relation Age of Onset   Alcohol abuse Father    Heart attack Brother    Hyperlipidemia Son    Hypertension Son      Social Connections: Not on file     Current Medications[1]   Physical Exam:   BP (!) 174/84 (BP Location: Left Arm, Patient Position: Sitting, Cuff Size: Large)   Pulse 77   Ht 6' (1.829 m)   Wt 215 lb (97.5 kg)   SpO2 (!) 83%   BMI 29.16 kg/m   Pertinent Findings  CN II-XII grossly intact Bilateral EAC clear and TM intact with well pneumatized middle ear spaces Cochlear implant in place  Anterior rhinoscopy: Septum midline; bilateral inferior turbinates with no hypertrophy  No lesions of oral cavity/oropharynx; dentition wnl  No obviously palpable neck masses/lymphadenopathy/thyromegaly No respiratory distress or stridor   Seprately Identifiable Procedures:  None  Impression & Plans:  Jonathan Sparks is a 84 y.o. 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 No surgical complications; issue with external processor hardware. Audiology input required for insurance referral and processor replacement. -  Will contact him post-audiology discussion to clarify next steps and provide referral or documentation.      - f/u PRN   Thank you for allowing me the opportunity to care for your patient. Please do not hesitate to contact me should you have any other questions.  Sincerely, Chyrl Cohen PA-C [facility] Phone: [phone] Fax:  [phone]  11/17/2024, 3:55 PM         [1]  Current Outpatient Medications:    atorvastatin  (LIPITOR) 40 MG tablet, Take 1 tablet (40 mg total) by mouth daily., Disp: 90 tablet, Rfl: 2   lisinopril  (ZESTRIL ) 10 MG tablet, Take 1 tablet (10 mg total) by mouth daily. **NEEDS TO BE SEEN BEFORE NEXT REFILL**, Disp: 90 tablet, Rfl: 2   senna-docusate (SENOKOT-S) 8.6-50 MG tablet, Take 1 tablet by mouth at bedtime. (Patient not taking: Reported on 11/17/2024), Disp: 30 tablet, Rfl: 0
